# Patient Record
Sex: Male | Born: 1975
Health system: Southern US, Community
[De-identification: ages and names within clinical notes are randomized; demographics above are authoritative.]

## PROBLEM LIST (undated history)

## (undated) DIAGNOSIS — T7840XA Allergy, unspecified, initial encounter: Secondary | ICD-10-CM

## (undated) DIAGNOSIS — K219 Gastro-esophageal reflux disease without esophagitis: Secondary | ICD-10-CM

## (undated) DIAGNOSIS — K5792 Diverticulitis of intestine, part unspecified, without perforation or abscess without bleeding: Secondary | ICD-10-CM

## (undated) DIAGNOSIS — Z8619 Personal history of other infectious and parasitic diseases: Secondary | ICD-10-CM

## (undated) DIAGNOSIS — H8109 Meniere's disease, unspecified ear: Secondary | ICD-10-CM

## (undated) DIAGNOSIS — K589 Irritable bowel syndrome without diarrhea: Secondary | ICD-10-CM

## (undated) HISTORY — DX: Gastro-esophageal reflux disease without esophagitis: K21.9

## (undated) HISTORY — DX: Diverticulitis of intestine, part unspecified, without perforation or abscess without bleeding: K57.92

## (undated) HISTORY — PX: WISDOM TOOTH EXTRACTION: SHX21

## (undated) HISTORY — DX: Personal history of other infectious and parasitic diseases: Z86.19

## (undated) HISTORY — DX: Meniere's disease, unspecified ear: H81.09

## (undated) HISTORY — DX: Allergy, unspecified, initial encounter: T78.40XA

---
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
## 2001-11-29 ENCOUNTER — Encounter: Admission: RE | Admit: 2001-11-29 | Discharge: 2001-11-29 | Payer: Self-pay | Admitting: Family Medicine

## 2001-11-29 ENCOUNTER — Encounter: Payer: Self-pay | Admitting: Family Medicine

## 2007-03-29 ENCOUNTER — Emergency Department (HOSPITAL_COMMUNITY): Admission: EM | Admit: 2007-03-29 | Discharge: 2007-03-29 | Payer: Self-pay | Admitting: Emergency Medicine

## 2007-07-12 ENCOUNTER — Emergency Department (HOSPITAL_COMMUNITY): Admission: EM | Admit: 2007-07-12 | Discharge: 2007-07-12 | Payer: Self-pay | Admitting: Emergency Medicine

## 2014-09-18 ENCOUNTER — Ambulatory Visit (INDEPENDENT_AMBULATORY_CARE_PROVIDER_SITE_OTHER): Payer: 59 | Admitting: Urology

## 2014-09-18 ENCOUNTER — Encounter: Payer: Self-pay | Admitting: Urology

## 2014-09-18 VITALS — BP 115/77 | HR 65 | Ht 68.0 in | Wt 229.0 lb

## 2014-09-18 DIAGNOSIS — Z3009 Encounter for other general counseling and advice on contraception: Secondary | ICD-10-CM

## 2014-09-18 DIAGNOSIS — Z309 Encounter for contraceptive management, unspecified: Secondary | ICD-10-CM

## 2014-09-18 DIAGNOSIS — Z0000 Encounter for general adult medical examination without abnormal findings: Secondary | ICD-10-CM | POA: Insufficient documentation

## 2014-09-18 NOTE — Progress Notes (Signed)
09/18/2014 9:37 AM   Chad Ward [birth year] [id number]  Referring provider: No referring provider defined for this encounter.  Chief Complaint  Patient presents with  . VAS Consult    HPI: 1 - Desire for Male Sterilization - pt with 3 healthy boys. He and his wife have been considering vasectomy as means of sterilization for about 1 year. No h/o easy bleeding or GU trauma. Vas palpable bilaterally but with some sdifficulty due to very stocky body habitus and thick cords.  PMH unremarkable otherwise  Today "Chad Ward" is seen as new patient for above. He is self-referred.    PMH: Past Medical History  Diagnosis Date  . GERD (gastroesophageal reflux disease)     Surgical History: Past Surgical History  Procedure Laterality Date  . Tonsillectomy and adenoidectomy  1988    Home Medications:    Medication List       This list is accurate as of: 09/18/14  9:37 AM.  Always use your most recent med list.               loratadine 10 MG tablet  Commonly known as:  CLARITIN  Take 10 mg by mouth daily.        Allergies: No Known Allergies  Family History: Family History  Problem Relation Age of Onset  . Prostate cancer Neg Hx   . Kidney cancer Neg Hx   . Bladder Cancer Neg Hx     Social History:  reports that he has never smoked. He does not have any smokeless tobacco history on file. He reports that he does not drink alcohol or use illicit drugs.  ROS: UROLOGY Frequent Urination?: No Hard to postpone urination?: No Burning/pain with urination?: No Get up at night to urinate?: No Leakage of urine?: No Urine stream starts and stops?: No Trouble starting stream?: No Do you have to strain to urinate?: No Blood in urine?: No Urinary tract infection?: No Sexually transmitted disease?: No Injury to kidneys or bladder?: No Painful intercourse?: No Weak stream?: No Erection problems?: No Penile pain?: No  Gastrointestinal Nausea?: No Vomiting?:  No Indigestion/heartburn?: No Diarrhea?: Yes Constipation?: No  Constitutional Fever: No Night sweats?: No Weight loss?: No Fatigue?: No  Skin Skin rash/lesions?: No Itching?: No  Eyes Blurred vision?: No Double vision?: No  Ears/Nose/Throat Sore throat?: No Sinus problems?: Yes  Hematologic/Lymphatic Swollen glands?: No Easy bruising?: No  Cardiovascular Leg swelling?: No Chest pain?: No  Respiratory Cough?: No Shortness of breath?: No  Endocrine Excessive thirst?: No  Musculoskeletal Back pain?: No Joint pain?: No  Neurological Headaches?: No Dizziness?: No  Psychologic Depression?: No Anxiety?: No  Physical Exam: BP 115/77 mmHg  Pulse 65  Ht 5\' 8"  (1.727 m)  Wt 229 lb (103.874 kg)  BMI 34.83 kg/m2  Constitutional:  Alert and oriented, No acute distress. HEENT: [place] AT, moist mucus membranes.  Trachea midline, no masses. Cardiovascular: No clubbing, cyanosis, or edema. Respiratory: Normal respiratory effort, no increased work of breathing. GI: Abdomen is soft, nontender, nondistended, no abdominal masses GU: No CVA tenderness. Bilateral vas palpable but thick cords and abbreviated scrotum, hidden penis.  Skin: No rashes, bruises or suspicious lesions. Lymph: No cervical or inguinal adenopathy. Neurologic: Grossly intact, no focal deficits, moving all 4 extremities. Psychiatric: Normal mood and affect.  Laboratory Data: No results found for: WBC, HGB, HCT, MCV, PLT  No results found for: CREATININE  No results found for: PSA  No results found for: TESTOSTERONE  No results found  for: HGBA1C  Urinalysis No results found for: COLORURINE, APPEARANCEUR, LABSPEC, PHURINE, GLUCOSEU, HGBUR, BILIRUBINUR, KETONESUR, PROTEINUR, UROBILINOGEN, NITRITE, LEUKOCYTESUR  Pertinent Imaging: none  Assessment & Plan:    1. Vasectomy evaluation - Discussed risks, benefits, alternatives in detail including other means of contraception and expected  peri-procedural course. Rec done i nOR due to body habitus and he is amenable. Also explained need for f/u negative semen sample prior to unprotected intercourse and he voiced understanding.    Return in about 3 months (around 12/19/2014).  Alexis Frock, [facility] [address] [place], [place] [address] [phone]

## 2014-09-21 ENCOUNTER — Encounter: Payer: Self-pay | Admitting: *Deleted

## 2014-09-21 ENCOUNTER — Inpatient Hospital Stay: Admission: RE | Admit: 2014-09-21 | Payer: Self-pay | Source: Ambulatory Visit

## 2014-09-21 NOTE — Patient Instructions (Signed)
  Your procedure is scheduled on: 09-26-14 Report to [place] To find out your arrival time please call [phone] between 1PM - 3PM on 09-25-14  Remember: Instructions that are not followed completely may result in serious medical risk, up to and including death, or upon the discretion of your surgeon and anesthesiologist your surgery may need to be rescheduled.    _X___ 1. Do not eat food or drink liquids after midnight. No gum chewing or hard candies.     _X___ 2. No Alcohol for 24 hours before or after surgery.   ____ 3. Bring all medications with you on the day of surgery if instructed.    ____ 4. Notify your doctor if there is any change in your medical condition     (cold, fever, infections).     Do not wear jewelry, make-up, hairpins, clips or nail polish.  Do not wear lotions, powders, or perfumes. You may wear deodorant.  Do not shave 48 hours prior to surgery. Men may shave face and neck.  Do not bring valuables to the hospital.    [facility] is not responsible for any belongings or valuables.               Contacts, dentures or bridgework may not be worn into surgery.  Leave your suitcase in the car. After surgery it may be brought to your room.  For patients admitted to the hospital, discharge time is determined by your  treatment team.   Patients discharged the day of surgery will not be allowed to drive home.   Please read over the following fact sheets that you were given:      ____ Take these medicines the morning of surgery with A SIP OF WATER:    1. NONE  2.   3.   4.  5.  6.  ____ Fleet Enema (as directed)   ____ Use CHG Soap as directed  ____ Use inhalers on the day of surgery  ____ Stop metformin 2 days prior to surgery    ____ Take 1/2 of usual insulin dose the night before surgery and none on the morning of surgery.   ____ Stop Coumadin/Plavix/aspirin-N/A  _X___ Stop Anti-inflammatories-STOP IBUPROFEN NOW-NO  NSAIDS OR ASA PRODUCTS-TYLENOL OK   ____ Stop supplements until after surgery.    ____ Bring C-Pap to the hospital.

## 2014-09-26 ENCOUNTER — Ambulatory Visit: Payer: 59 | Admitting: *Deleted

## 2014-09-26 ENCOUNTER — Ambulatory Visit
Admission: RE | Admit: 2014-09-26 | Discharge: 2014-09-26 | Disposition: A | Discharge status: Home / Self Care | Payer: 59 | Source: Ambulatory Visit | Attending: Urology | Admitting: Urology

## 2014-09-26 ENCOUNTER — Encounter: Payer: Self-pay | Admitting: *Deleted

## 2014-09-26 ENCOUNTER — Encounter
Admission: RE | Disposition: A | Discharge status: Home / Self Care | Payer: Self-pay | Source: Ambulatory Visit | Attending: Urology

## 2014-09-26 DIAGNOSIS — Z302 Encounter for sterilization: Secondary | ICD-10-CM | POA: Insufficient documentation

## 2014-09-26 DIAGNOSIS — E669 Obesity, unspecified: Secondary | ICD-10-CM | POA: Insufficient documentation

## 2014-09-26 DIAGNOSIS — Z6834 Body mass index (BMI) 34.0-34.9, adult: Secondary | ICD-10-CM | POA: Insufficient documentation

## 2014-09-26 DIAGNOSIS — K219 Gastro-esophageal reflux disease without esophagitis: Secondary | ICD-10-CM | POA: Insufficient documentation

## 2014-09-26 HISTORY — PX: VASECTOMY: SHX75

## 2014-09-26 SURGERY — VASECTOMY
Anesthesia: General | Wound class: Clean Contaminated

## 2014-09-26 MED ORDER — FAMOTIDINE 20 MG PO TABS
ORAL_TABLET | ORAL | Status: AC
  Administered 2014-09-26: 20 mg via ORAL
  Filled 2014-09-26: qty 1

## 2014-09-26 MED ORDER — CLINDAMYCIN PHOSPHATE 900 MG/50ML IV SOLN
900.0000 mg | Freq: Once | INTRAVENOUS | Status: AC
  Administered 2014-09-26: 900 mg via INTRAVENOUS

## 2014-09-26 MED ORDER — ONDANSETRON HCL 4 MG/2ML IJ SOLN: 4.0000 mg | Freq: Once | INTRAMUSCULAR | Status: DC | PRN

## 2014-09-26 MED ORDER — BUPIVACAINE HCL 0.5 % IJ SOLN
INTRAMUSCULAR | Status: DC | PRN
  Administered 2014-09-26: 7 mL

## 2014-09-26 MED ORDER — LACTATED RINGERS IV SOLN
INTRAVENOUS | Status: DC
  Administered 2014-09-26: 14:00:00 via INTRAVENOUS

## 2014-09-26 MED ORDER — FENTANYL CITRATE (PF) 100 MCG/2ML IJ SOLN
25.0000 ug | INTRAMUSCULAR | Status: DC | PRN
Start: 2014-09-26 — End: 2014-09-26

## 2014-09-26 MED ORDER — MIDAZOLAM HCL 2 MG/2ML IJ SOLN
INTRAMUSCULAR | Status: DC | PRN
  Administered 2014-09-26: 2 mg via INTRAVENOUS

## 2014-09-26 MED ORDER — CLINDAMYCIN PHOSPHATE 900 MG/50ML IV SOLN
INTRAVENOUS | Status: AC
  Administered 2014-09-26: 900 mg via INTRAVENOUS
  Filled 2014-09-26: qty 50

## 2014-09-26 MED ORDER — PROPOFOL 10 MG/ML IV BOLUS
INTRAVENOUS | Status: DC | PRN
  Administered 2014-09-26 (×3): 50 mg via INTRAVENOUS
  Administered 2014-09-26: 100 mg via INTRAVENOUS

## 2014-09-26 MED ORDER — FENTANYL CITRATE (PF) 100 MCG/2ML IJ SOLN
INTRAMUSCULAR | Status: DC | PRN
  Administered 2014-09-26: 100 ug via INTRAVENOUS
  Administered 2014-09-26 (×2): 25 ug via INTRAVENOUS

## 2014-09-26 MED ORDER — FAMOTIDINE 20 MG PO TABS
20.0000 mg | ORAL_TABLET | Freq: Once | ORAL | Status: AC
  Administered 2014-09-26: 20 mg via ORAL

## 2014-09-26 MED ORDER — BUPIVACAINE HCL (PF) 0.5 % IJ SOLN
INTRAMUSCULAR | Status: AC
  Filled 2014-09-26: qty 30

## 2014-09-26 SURGICAL SUPPLY — 30 items
BLADE CLIPPER SURG (BLADE) ×3 IMPLANT
BLADE SURG 15 STRL LF DISP TIS (BLADE) ×1 IMPLANT
BLADE SURG 15 STRL SS (BLADE) ×3
CANISTER SUCT 1200ML W/VALVE (MISCELLANEOUS) ×3 IMPLANT
CHLORAPREP W/TINT 26ML (MISCELLANEOUS) ×3 IMPLANT
DRESSING TELFA 4X3 1S ST N-ADH (GAUZE/BANDAGES/DRESSINGS) ×3 IMPLANT
ELECT CAUTERY NEEDLE TIP 1.0 (MISCELLANEOUS) ×3
ELECTRODE CAUTERY NEDL TIP 1.0 (MISCELLANEOUS) ×1 IMPLANT
GAUZE FLUFF 18X24 1PLY STRL (GAUZE/BANDAGES/DRESSINGS) ×3 IMPLANT
GAUZE SPONGE 4X4 12PLY STRL (GAUZE/BANDAGES/DRESSINGS) ×1 IMPLANT
GLOVE BIO SURGEON STRL SZ 6.5 (GLOVE) ×2 IMPLANT
GLOVE BIO SURGEONS STRL SZ 6.5 (GLOVE) ×1
GOWN STRL REUS W/ TWL LRG LVL3 (GOWN DISPOSABLE) ×2 IMPLANT
GOWN STRL REUS W/TWL LRG LVL3 (GOWN DISPOSABLE) ×6
KIT RM TURNOVER STRD PROC AR (KITS) ×3 IMPLANT
LABEL OR SOLS (LABEL) ×1 IMPLANT
NDL SAFETY 22GX1.5 (NEEDLE) ×3 IMPLANT
NDL SAFETY 25GX1.5 (NEEDLE) ×3 IMPLANT
NS IRRIG 500ML POUR BTL (IV SOLUTION) ×3 IMPLANT
PACK BASIN MINOR ARMC (MISCELLANEOUS) ×3 IMPLANT
PAD GROUND ADULT SPLIT (MISCELLANEOUS) ×3 IMPLANT
PREP PVP WINGED SPONGE (MISCELLANEOUS) ×1 IMPLANT
SUCTION FRAZIER TIP 10 FR DISP (SUCTIONS) ×3 IMPLANT
SUPPORETR ATHLETIC LG (MISCELLANEOUS) ×1 IMPLANT
SUPPORTER ATHLETIC LG (MISCELLANEOUS) ×3
SUT CHROMIC 3 0 PS 2 (SUTURE) ×3 IMPLANT
SUT VIC AB 3-0 SH 27 (SUTURE) ×3
SUT VIC AB 3-0 SH 27X BRD (SUTURE) ×1 IMPLANT
SYRINGE 10CC LL (SYRINGE) ×3 IMPLANT
WATER STERILE IRR 1000ML POUR (IV SOLUTION) ×1 IMPLANT

## 2014-09-26 NOTE — Interval H&P Note (Signed)
History and Physical Interval Note:  09/26/2014 1:44 PM  Chad Ward  has presented today for surgery, with the diagnosis of sterilization  The various methods of treatment have been discussed with the patient and family. After consideration of risks, benefits and other options for treatment, the patient has consented to  Procedure(s): VASECTOMY (N/A) as a surgical intervention .  The patient's history has been reviewed, patient examined, no change in status, stable for surgery.  I have reviewed the patient's chart and labs.  Questions were answered to the patient's satisfaction.    RRR CTAB  Hollice Espy

## 2014-09-26 NOTE — Discharge Instructions (Addendum)
Vasectomy                                    Patient Education and Post Care   If you were provided a sedative (Valium) you must have someone available to drive you home after your procedure. Avoid strenuous activity for 48 hours after your procedure. This includes any heavy lifting. You may return to work the next day, as long as no strenuous activity is required. Leave bandage in place for the next 24hrs. If you have any difficulty urinating remove you may remove bandage earlier. Then keep area clean and dry. You may shower after 24 hours. Do not take a bath or use a hot tub for five (5) days. You may apply ice or a cold pack to the scrotum as needed, for 10 minutes of every hours. (A bag of frozen peas will mold to the area). This may help reduce any pain or swelling. It may also be helpful to wear supportive underwear, such as briefs. Expect some mild pain, mild swelling of the scrotum and possible slight fluid leak from the site of the puncture. A gauze pad may be applied to the scrotum if there is any leakage from the puncture site. This drainage may continue for several days and is normal. You may continue your normal diet. After the procedure, you will be given 1-2 specimen cups. You need to do sperm checks at 6 and/or 12 weeks. Please bring the specimen back to our office to be sent out for analysis. You may resume having sex 1 week after procedure, if comfortable enough. Until you are told that you are sterile, it is essential that you use another form of birth control until otherwise notified by our office. Take Extra-Strength Tylenol, Motrin or Advil as needed for any pain or discomfort. Follow the package directions regarding dose. Stronger prescription pain medication is provided, but most people do not find that it is necessary. If you experience unusual or severe pain that is not relieved by pain medication, excessive bleeding  or drainage, excessive swelling or redness, foul odor or a fever over 101 F, please contact our office.  [facility] [address] [place], [place] [address] [phone]       General Anesthesia, Care After Refer to this sheet in the next few weeks. These instructions provide you with information on caring for yourself after your procedure. Your health care provider may also give you more specific instructions. Your treatment has been planned according to current medical practices, but problems sometimes occur. Call your health care provider if you have any problems or questions after your procedure. WHAT TO EXPECT AFTER THE PROCEDURE After the procedure, it is typical to experience:  Sleepiness.  Nausea and vomiting. HOME CARE INSTRUCTIONS  For the first 24 hours after general anesthesia:  Have a responsible person with you.  Do not drive a car. If you are alone, do not take public transportation.  Do not drink alcohol.  Do not take medicine that has not been prescribed by your health care provider.  Do not sign important papers or make important decisions.  You may resume a normal diet and activities as directed by your health care provider.  Change bandages (dressings) as directed.  If you have questions or problems that seem related to general anesthesia, call the hospital and ask for the anesthetist or anesthesiologist on call. SEEK MEDICAL CARE IF:  You have nausea and vomiting that continue the day after anesthesia.  You develop a rash. SEEK IMMEDIATE MEDICAL CARE IF:   You have difficulty breathing.  You have chest pain.  You have any allergic problems. Document Released: 05/18/2000 Document Revised: 02/14/2013 Document Reviewed: 08/25/2012 [facility] Patient Information 2015 [place], [place]. This information is not intended to replace advice given to you by your health care provider. Make sure you discuss any questions  you have with your health care provider.

## 2014-09-26 NOTE — Anesthesia Preprocedure Evaluation (Signed)
Anesthesia Evaluation  Patient identified by MRN, date of birth, ID band Patient awake    Reviewed: Allergy & Precautions, NPO status , Patient's Chart, lab work & pertinent test results  Airway Mallampati: III  TM Distance: >3 FB Neck ROM: Full    Dental  (+) Teeth Intact   Pulmonary    Pulmonary exam normal       Cardiovascular Exercise Tolerance: Good negative cardio ROS Normal cardiovascular exam    Neuro/Psych    GI/Hepatic GERD-  Medicated and Controlled,  Endo/Other    Renal/GU      Musculoskeletal   Abdominal (+) + obese,   Peds  Hematology   Anesthesia Other Findings   Reproductive/Obstetrics                             Anesthesia Physical Anesthesia Plan  ASA: II  Anesthesia Plan: General   Post-op Pain Management:    Induction: Intravenous  Airway Management Planned: LMA  Additional Equipment:   Intra-op Plan:   Post-operative Plan: Extubation in OR  Informed Consent: I have reviewed the patients History and Physical, chart, labs and discussed the procedure including the risks, benefits and alternatives for the proposed anesthesia with the patient or authorized representative who has indicated his/her understanding and acceptance.     Plan Discussed with: CRNA  Anesthesia Plan Comments:         Anesthesia Quick Evaluation

## 2014-09-26 NOTE — Anesthesia Postprocedure Evaluation (Signed)
  Anesthesia Post-op Note  Patient: Park Meo  Procedure(s) Performed: Procedure(s): VASECTOMY (N/A)  Anesthesia type:General  Patient location: PACU  Post pain: Pain level controlled  Post assessment: Post-op Vital signs reviewed, Patient's Cardiovascular Status Stable, Respiratory Function Stable, Patent Airway and No signs of Nausea or vomiting  Post vital signs: Reviewed and stable  Last Vitals:  Filed Vitals:   09/26/14 1317  BP: 121/59  Pulse: 60  Temp: 36.6 C  Resp:     Level of consciousness: awake, alert  and patient cooperative  Complications: No apparent anesthesia complications

## 2014-09-26 NOTE — Transfer of Care (Signed)
Immediate Anesthesia Transfer of Care Note  Patient: Chad Ward  Procedure(s) Performed: Procedure(s): VASECTOMY (N/A)  Patient Location: PACU  Anesthesia Type:General  Level of Consciousness: sedated and responds to stimulation  Airway & Oxygen Therapy: Patient Spontanous Breathing and Patient connected to nasal cannula oxygen  Post-op Assessment: Report given to RN and Post -op Vital signs reviewed and stable  Post vital signs: Reviewed and stable  Last Vitals:  Filed Vitals:   09/26/14 1452  BP: 98/60  Pulse: 64  Temp: 36.6 C  Resp: 13    Complications: No apparent anesthesia complications

## 2014-09-26 NOTE — H&P (View-Only) (Signed)
09/18/2014 9:37 AM   Chad Ward [birth year] [id number]  Referring provider: No referring provider defined for this encounter.  Chief Complaint  Patient presents with  . VAS Consult    HPI: 1 - Desire for Male Sterilization - pt with 3 healthy boys. He and his wife have been considering vasectomy as means of sterilization for about 1 year. No h/o easy bleeding or GU trauma. Vas palpable bilaterally but with some sdifficulty due to very stocky body habitus and thick cords.  PMH unremarkable otherwise  Today "Chad Ward" is seen as new patient for above. He is self-referred.    PMH: Past Medical History  Diagnosis Date  . GERD (gastroesophageal reflux disease)     Surgical History: Past Surgical History  Procedure Laterality Date  . Tonsillectomy and adenoidectomy  1988    Home Medications:    Medication List       This list is accurate as of: 09/18/14  9:37 AM.  Always use your most recent med list.               loratadine 10 MG tablet  Commonly known as:  CLARITIN  Take 10 mg by mouth daily.        Allergies: No Known Allergies  Family History: Family History  Problem Relation Age of Onset  . Prostate cancer Neg Hx   . Kidney cancer Neg Hx   . Bladder Cancer Neg Hx     Social History:  reports that he has never smoked. He does not have any smokeless tobacco history on file. He reports that he does not drink alcohol or use illicit drugs.  ROS: UROLOGY Frequent Urination?: No Hard to postpone urination?: No Burning/pain with urination?: No Get up at night to urinate?: No Leakage of urine?: No Urine stream starts and stops?: No Trouble starting stream?: No Do you have to strain to urinate?: No Blood in urine?: No Urinary tract infection?: No Sexually transmitted disease?: No Injury to kidneys or bladder?: No Painful intercourse?: No Weak stream?: No Erection problems?: No Penile pain?: No  Gastrointestinal Nausea?: No Vomiting?:  No Indigestion/heartburn?: No Diarrhea?: Yes Constipation?: No  Constitutional Fever: No Night sweats?: No Weight loss?: No Fatigue?: No  Skin Skin rash/lesions?: No Itching?: No  Eyes Blurred vision?: No Double vision?: No  Ears/Nose/Throat Sore throat?: No Sinus problems?: Yes  Hematologic/Lymphatic Swollen glands?: No Easy bruising?: No  Cardiovascular Leg swelling?: No Chest pain?: No  Respiratory Cough?: No Shortness of breath?: No  Endocrine Excessive thirst?: No  Musculoskeletal Back pain?: No Joint pain?: No  Neurological Headaches?: No Dizziness?: No  Psychologic Depression?: No Anxiety?: No  Physical Exam: BP 115/77 mmHg  Pulse 65  Ht 5\' 8"  (1.727 m)  Wt 229 lb (103.874 kg)  BMI 34.83 kg/m2  Constitutional:  Alert and oriented, No acute distress. HEENT: [place] AT, moist mucus membranes.  Trachea midline, no masses. Cardiovascular: No clubbing, cyanosis, or edema. Respiratory: Normal respiratory effort, no increased work of breathing. GI: Abdomen is soft, nontender, nondistended, no abdominal masses GU: No CVA tenderness. Bilateral vas palpable but thick cords and abbreviated scrotum, hidden penis.  Skin: No rashes, bruises or suspicious lesions. Lymph: No cervical or inguinal adenopathy. Neurologic: Grossly intact, no focal deficits, moving all 4 extremities. Psychiatric: Normal mood and affect.  Laboratory Data: No results found for: WBC, HGB, HCT, MCV, PLT  No results found for: CREATININE  No results found for: PSA  No results found for: TESTOSTERONE  No results found  for: HGBA1C  Urinalysis No results found for: COLORURINE, APPEARANCEUR, LABSPEC, PHURINE, GLUCOSEU, HGBUR, BILIRUBINUR, KETONESUR, PROTEINUR, UROBILINOGEN, NITRITE, LEUKOCYTESUR  Pertinent Imaging: none  Assessment & Plan:    1. Vasectomy evaluation - Discussed risks, benefits, alternatives in detail including other means of contraception and expected  peri-procedural course. Rec done i nOR due to body habitus and he is amenable. Also explained need for f/u negative semen sample prior to unprotected intercourse and he voiced understanding.    Return in about 3 months (around 12/19/2014).  Alexis Frock, [facility] [address] [place], [place] [address] [phone]

## 2014-09-26 NOTE — Op Note (Signed)
Date of procedure: 09/26/2014  Preoperative diagnosis:  1. Desires vasectomy    Postoperative diagnosis:  1. Same as above   Procedure: 1. Bilateral vasectomy  Surgeon: Hollice Espy, MD  Anesthesia: General  Complications: None  EBL: minimal  Specimens: bilateral vasal segments  Drains: non3  Indication: Chad Ward is a 39 y.o. patient with desiring vasectomy  After reviewing the management options for treatment, he elected to proceed with the above surgical procedure(s). We have discussed the potential benefits and risks of the procedure, side effects of the proposed treatment, the likelihood of the patient achieving the goals of the procedure, and any potential problems that might occur during the procedure or recuperation. Informed consent has been obtained.  Description of procedure:  The patient was taken to the operating room and MAC was induced.  The patient was placed in the supine position, prepped and draped in the usual sterile fashion, and preoperative antibiotics were administered. A preoperative time-out was performed.   Bilateral Vasectomy Procedure  Pre-Procedure: - Patient's scrotum was prepped and draped for vasectomy. - The vas was palpated through the scrotal skin on the left. - 0.5 % marcaine was injected into the skin and surrounding tissue for placement  - In a similar manner, the vas on the right was identified, anesthetized, and stabilized.  Procedure: - A bladeless technique was used to open the overlying skin (sharp dissection) - The left vas was isolated and brought up through the incision exposing that structure. - Bleeding points were cauterized as they occurred. - The vas was free from the surrounding structures and brought to the view. - A segment was positioned for placement with a hemostat. - A second hemostat was placed and a small segment between the two hemostats and was removed for inspection. - Each end of the transected vas  lumen was fulgurated/ obliterated using needlepoint electrocautery -A fascial interposition was performed on testicular end of the vas using #3-0 chromic suture -The same procedure was performed on the right. - A single suture of #3-0 chromic catgut was used to close each lateral scrotal skin incision - A dressing was applied.  Post-Procedure: - Patient was instructed in care of the operative area - A specimen is to be delivered in 12 weeks   -Another form of contraception is to be used until    Hollice Espy, M.D.

## 2014-09-28 LAB — SURGICAL PATHOLOGY

## 2015-01-04 ENCOUNTER — Other Ambulatory Visit: Payer: 59

## 2015-01-04 DIAGNOSIS — Z310 Encounter for reversal of previous sterilization: Secondary | ICD-10-CM

## 2015-01-06 LAB — POST-VAS SPERM EVALUATION,QUAL: Volume: 4.5 mL

## 2015-01-08 ENCOUNTER — Telehealth: Payer: Self-pay

## 2015-01-08 NOTE — Telephone Encounter (Signed)
Spoke with pt in reference to vas sample. Pt voiced understanding. Pt will return in a month with another sample.

## 2015-01-08 NOTE — Telephone Encounter (Signed)
-----   Message from Hollice Espy, MD sent at 01/07/2015  8:51 AM EST ----- Please warn this patient that there IS STILL SPERM in his sample.  I would like him to repeat it in 1 month and continue to use alternative forms of birth control!!!!!  It is possible for this to happen and I am able to confirm that both of his vasa were cut with the portion excised as confirmed by pathologic specimen. It just may take some or time to clear the residual sperm.   Hollice Espy, MD

## 2015-02-08 ENCOUNTER — Other Ambulatory Visit: Payer: 59

## 2015-02-08 DIAGNOSIS — Z310 Encounter for reversal of previous sterilization: Secondary | ICD-10-CM

## 2015-02-10 LAB — POST-VAS SPERM EVALUATION,QUAL
POST-VAS SPERM, QUAL: ABSENT
SEMEN VOLUME: 3.6 mL

## 2015-02-12 ENCOUNTER — Telehealth: Payer: Self-pay

## 2015-02-12 NOTE — Telephone Encounter (Signed)
Spoke with pt in reference to vas specimen. Pt voiced understanding stating he would bring in another specimen next month.

## 2015-02-12 NOTE — Telephone Encounter (Signed)
-----   Message from Hollice Espy, MD sent at 02/11/2015  4:04 PM EST ----- Please let this patient know that there were NO sperm this time.  Since there were sperm only 1 month ago, its probably a good idea to do this [place] to be sure until officially clearing him.    Hollice Espy, MD

## 2015-04-29 ENCOUNTER — Other Ambulatory Visit: Payer: 59

## 2015-04-29 DIAGNOSIS — Z9852 Vasectomy status: Secondary | ICD-10-CM | POA: Diagnosis not present

## 2015-04-30 LAB — POST-VAS SPERM EVALUATION,QUAL
Post-Vas Sperm, Qual: ABSENT
SEMEN VOLUME: 4.5 mL

## 2015-05-01 ENCOUNTER — Telehealth: Payer: Self-pay

## 2015-05-01 NOTE — Telephone Encounter (Signed)
LMOM- most recent analysis negative. Can not birth control.

## 2015-05-01 NOTE — Telephone Encounter (Signed)
-----   Message from Festus Aloe, MD sent at 05/01/2015  1:29 PM EST ----- Notify patient his second semen analysis showed no sperm.  He may stop other birth control.   ----- Message -----    From: Lestine Box, LPN    Sent: [id number]   8:31 AM      To: Festus Aloe, MD    ----- Message -----    From: [facility] Lab Results In Interface    Sent: 04/30/2015   4:40 PM      To: Rowe Robert Clinical

## 2017-06-22 ENCOUNTER — Other Ambulatory Visit: Payer: Self-pay

## 2017-06-22 ENCOUNTER — Emergency Department (INDEPENDENT_AMBULATORY_CARE_PROVIDER_SITE_OTHER): Payer: 59

## 2017-06-22 ENCOUNTER — Emergency Department
Admission: EM | Admit: 2017-06-22 | Discharge: 2017-06-22 | Disposition: A | Discharge status: Home / Self Care | Payer: 59 | Source: Home / Self Care

## 2017-06-22 DIAGNOSIS — R05 Cough: Secondary | ICD-10-CM | POA: Diagnosis not present

## 2017-06-22 DIAGNOSIS — J9811 Atelectasis: Secondary | ICD-10-CM

## 2017-06-22 DIAGNOSIS — J209 Acute bronchitis, unspecified: Secondary | ICD-10-CM | POA: Diagnosis not present

## 2017-06-22 MED ORDER — DOXYCYCLINE HYCLATE 100 MG PO CAPS: 100.0000 mg | ORAL_CAPSULE | Freq: Two times a day (BID) | ORAL | 0 refills | Status: DC

## 2017-06-22 MED ORDER — ALBUTEROL SULFATE HFA 108 (90 BASE) MCG/ACT IN AERS: 1.0000 | INHALATION_SPRAY | Freq: Four times a day (QID) | RESPIRATORY_TRACT | 0 refills | Status: DC | PRN

## 2017-06-22 MED FILL — VENTOLIN HFA 90 MCG INHALER: 108 (90 BAS | 25 days supply | Qty: 18 | Fill #0

## 2017-06-22 MED FILL — DOXYCYCLINE HYCLATE 100 MG: 100 | 10 days supply | Qty: 20 | Fill #0

## 2017-06-22 NOTE — ED Provider Notes (Signed)
[name]    CSN: [id number] Arrival date & time: 06/22/17  0855     History   Chief Complaint Chief Complaint  Patient presents with  . Cough    HPI Chad Ward is a 42 y.o. male.   The history is provided by the patient. No language interpreter was used.  Cough  Cough characteristics:  Productive Sputum characteristics:  Nondescript Severity:  Moderate Onset quality:  Gradual Duration:  4 weeks Timing:  Constant Progression:  Worsening Chronicity:  Recurrent Smoker: no   Context: exposure to allergens   Relieved by:  Nothing Worsened by:  Nothing Ineffective treatments:  None tried   Past Medical History:  Diagnosis Date  . GERD (gastroesophageal reflux disease)     Patient Active Problem List   Diagnosis Date Noted  . Vasectomy evaluation 09/18/2014    Past Surgical History:  Procedure Laterality Date  . TONSILLECTOMY AND ADENOIDECTOMY  1988  . VASECTOMY N/A 09/26/2014   Procedure: VASECTOMY;  Surgeon: Hollice Espy, MD;  Location: [facility] ORS;  Service: Urology;  Laterality: N/A;  . WISDOM TOOTH EXTRACTION         Home Medications    Prior to Admission medications   Medication Sig Start Date End Date Taking? Authorizing Provider  fexofenadine (ALLEGRA) 60 MG tablet Take 60 mg by mouth daily.   Yes [provider]  albuterol (PROVENTIL HFA;VENTOLIN HFA) 108 (90 Base) MCG/ACT inhaler Inhale 1-2 puffs into the lungs every 6 (six) hours as needed for wheezing or shortness of breath. 06/22/17   Fransico Meadow, PA-C  calcium carbonate (TUMS - DOSED IN MG ELEMENTAL CALCIUM) 500 MG chewable tablet Chew 1 tablet by mouth as needed for indigestion or heartburn.    [provider]  doxycycline (VIBRAMYCIN) 100 MG capsule Take 1 capsule (100 mg total) by mouth 2 (two) times daily. 06/22/17   Fransico Meadow, PA-C  ibuprofen (ADVIL,MOTRIN) 200 MG tablet Take 200 mg by mouth every 6 (six) hours as needed.    [provider]     Family History Family History  Problem Relation Age of Onset  . Prostate cancer Neg Hx   . Kidney cancer Neg Hx   . Bladder Cancer Neg Hx     Social History Social History   Tobacco Use  . Smoking status: Never Smoker  . Smokeless tobacco: Never Used  Substance Use Topics  . Alcohol use: No  . Drug use: No     Allergies   Patient has no known allergies.   Review of Systems Review of Systems  Respiratory: Positive for cough.   All other systems reviewed and are negative.    Physical Exam Triage Vital Signs ED Triage Vitals  Enc Vitals Group     BP 06/22/17 0925 118/80     Pulse Rate 06/22/17 0925 75     Resp 06/22/17 0925 18     Temp 06/22/17 0925 98.5 F (36.9 C)     Temp Source 06/22/17 0925 Oral     SpO2 06/22/17 0925 97 %     Weight 06/22/17 0926 227 lb (103 kg)     Height 06/22/17 0926 5\' 8"  (1.727 m)     Head Circumference --      Peak Flow --      Pain Score 06/22/17 0926 0     Pain Loc --      Pain Edu? --      Excl. in [place]? --  No data found.  Updated Vital Signs BP 118/80 (BP Location: Right Arm)   Pulse 75   Temp 98.5 F (36.9 C) (Oral)   Resp 18   Ht 5\' 8"  (1.727 m)   Wt 227 lb (103 kg)   SpO2 97%   BMI 34.52 kg/m   Visual Acuity Right Eye Distance:   Left Eye Distance:   Bilateral Distance:    Right Eye Near:   Left Eye Near:    Bilateral Near:     Physical Exam  Constitutional: He appears well-developed and well-nourished.  HENT:  Head: Normocephalic.  Eyes: Pupils are equal, round, and reactive to light.  Neck: Normal range of motion.  Cardiovascular: Normal rate.  Pulmonary/Chest: Effort normal.  Abdominal: Soft.  Neurological: He is alert.  Skin: Skin is warm.  Psychiatric: He has a normal mood and affect.  Nursing note and vitals reviewed.    UC Treatments / Results  Labs (all labs ordered are listed, but only abnormal results are displayed) Labs Reviewed - No data to  display  EKG None  Radiology Dg Chest 2 View  Result Date: 06/22/2017 CLINICAL DATA:  Cough, congestion EXAM: CHEST - 2 VIEW COMPARISON:  None. FINDINGS: Linear atelectasis in the left base. Right lung is clear. Heart is normal size. No effusions or acute bony abnormality. IMPRESSION: Left base atelectasis. Electronically Signed   By: Rolm Baptise M.D.   On: 06/22/2017 10:04    Procedures Procedures (including critical care time)  Medications Ordered in UC Medications - No data to display  Initial Impression / Assessment and Plan / UC Course  I have reviewed the triage vital signs and the nursing notes.  Pertinent labs & imaging results that were available during my care of the patient were reviewed by me and considered in my medical decision making (see chart for details).     MDM  No pneumonia Final Clinical Impressions(s) / UC Diagnoses   Final diagnoses:  Acute bronchitis, unspecified organism     Discharge Instructions     Return if any problems.   ED Prescriptions    Medication Sig Dispense Auth. Provider   doxycycline (VIBRAMYCIN) 100 MG capsule Take 1 capsule (100 mg total) by mouth 2 (two) times daily. 20 capsule Sofia, Leslie K, PA-C   albuterol (PROVENTIL HFA;VENTOLIN HFA) 108 (90 Base) MCG/ACT inhaler Inhale 1-2 puffs into the lungs every 6 (six) hours as needed for wheezing or shortness of breath. Oklee, Leslie K, [place]     Controlled Substance Prescriptions [place] Controlled Substance Registry consulted? Not Applicable   Fransico Meadow, [place] 06/22/17 1028

## 2017-06-22 NOTE — Discharge Instructions (Signed)
Return if any problems.

## 2017-06-22 NOTE — ED Triage Notes (Signed)
Pt c/o cough and congestion x 1 mos. Cough is sometimes productive. Pt also states he suffers from seasonal allergies but never with cough. Has been taking Robitussin as needed.

## 2017-06-26 ENCOUNTER — Encounter (HOSPITAL_BASED_OUTPATIENT_CLINIC_OR_DEPARTMENT_OTHER): Payer: Self-pay | Admitting: *Deleted

## 2017-06-26 ENCOUNTER — Other Ambulatory Visit: Payer: Self-pay

## 2017-06-26 DIAGNOSIS — Z79899 Other long term (current) drug therapy: Secondary | ICD-10-CM | POA: Insufficient documentation

## 2017-06-26 DIAGNOSIS — R05 Cough: Secondary | ICD-10-CM | POA: Diagnosis not present

## 2017-06-26 DIAGNOSIS — R0789 Other chest pain: Secondary | ICD-10-CM | POA: Diagnosis not present

## 2017-06-26 DIAGNOSIS — J40 Bronchitis, not specified as acute or chronic: Secondary | ICD-10-CM | POA: Diagnosis not present

## 2017-06-26 NOTE — ED Triage Notes (Signed)
Pt reports 4 weeks of cough. Seen Tuesday and had chest rxay and was started on antibiotics and inhaler. Tonight he reports feeling a "pop" in right ribs after coughing spell

## 2017-06-27 ENCOUNTER — Emergency Department (HOSPITAL_BASED_OUTPATIENT_CLINIC_OR_DEPARTMENT_OTHER): Payer: 59

## 2017-06-27 ENCOUNTER — Emergency Department (HOSPITAL_BASED_OUTPATIENT_CLINIC_OR_DEPARTMENT_OTHER)
Admission: EM | Admit: 2017-06-27 | Discharge: 2017-06-27 | Disposition: A | Discharge status: Home / Self Care | Payer: 59 | Attending: Emergency Medicine | Admitting: Emergency Medicine

## 2017-06-27 DIAGNOSIS — Z79899 Other long term (current) drug therapy: Secondary | ICD-10-CM | POA: Diagnosis not present

## 2017-06-27 DIAGNOSIS — R059 Cough, unspecified: Secondary | ICD-10-CM

## 2017-06-27 DIAGNOSIS — J40 Bronchitis, not specified as acute or chronic: Secondary | ICD-10-CM

## 2017-06-27 DIAGNOSIS — R05 Cough: Secondary | ICD-10-CM

## 2017-06-27 DIAGNOSIS — R0781 Pleurodynia: Secondary | ICD-10-CM | POA: Diagnosis not present

## 2017-06-27 DIAGNOSIS — R0789 Other chest pain: Secondary | ICD-10-CM

## 2017-06-27 MED ORDER — NAPROXEN 500 MG PO TABS: 500.0000 mg | ORAL_TABLET | Freq: Two times a day (BID) | ORAL | 0 refills | Status: DC

## 2017-06-27 MED ORDER — DEXAMETHASONE SODIUM PHOSPHATE 10 MG/ML IJ SOLN
10.0000 mg | Freq: Once | INTRAMUSCULAR | Status: AC
  Administered 2017-06-27: 10 mg via INTRAMUSCULAR
  Filled 2017-06-27: qty 1

## 2017-06-27 MED ORDER — CYCLOBENZAPRINE HCL 5 MG PO TABS
5.0000 mg | ORAL_TABLET | Freq: Once | ORAL | Status: AC
  Administered 2017-06-27: 5 mg via ORAL
  Filled 2017-06-27: qty 1

## 2017-06-27 NOTE — ED Provider Notes (Signed)
[facility] DEPART[facility] Provider Note   CSN: [id number] Arrival date & time: 06/26/17  2132     History   Chief Complaint Chief Complaint  Patient presents with  . Cough    HPI Chad Ward is a 42 y.o. male.  HPI  This is a 42 year old male who presents with right-sided chest wall pain.  Patient reports recent diagnosis of bronchitis.  He was seen in urgent care and given albuterol and doxycycline on Tuesday.  He reports some improvement with the albuterol.  However "I continue to cough."  Patient denies any fevers.  He reports symptoms over the last 2 to 3 weeks.  Tonight he was in the shower when he coughed forcefully and noted increased right sided chest wall pain.  It is sharp and nonradiating.  It is worse with movement and worse with coughing.  Currently his pain is 8 out of 10.  He did take an ibuprofen with minimal relief.  He is concerned he may have pulled something.  Past Medical History:  Diagnosis Date  . GERD (gastroesophageal reflux disease)     Patient Active Problem List   Diagnosis Date Noted  . Vasectomy evaluation 09/18/2014    Past Surgical History:  Procedure Laterality Date  . TONSILLECTOMY AND ADENOIDECTOMY  1988  . VASECTOMY N/A 09/26/2014   Procedure: VASECTOMY;  Surgeon: Hollice Espy, MD;  Location: [facility] ORS;  Service: Urology;  Laterality: N/A;  . WISDOM TOOTH EXTRACTION          Home Medications    Prior to Admission medications   Medication Sig Start Date End Date Taking? Authorizing Provider  albuterol (PROVENTIL HFA;VENTOLIN HFA) 108 (90 Base) MCG/ACT inhaler Inhale 1-2 puffs into the lungs every 6 (six) hours as needed for wheezing or shortness of breath. 06/22/17   Fransico Meadow, PA-C  calcium carbonate (TUMS - DOSED IN MG ELEMENTAL CALCIUM) 500 MG chewable tablet Chew 1 tablet by mouth as needed for indigestion or heartburn.    [provider]  doxycycline (VIBRAMYCIN) 100 MG capsule Take 1 capsule  (100 mg total) by mouth 2 (two) times daily. 06/22/17   Fransico Meadow, PA-C  fexofenadine (ALLEGRA) 60 MG tablet Take 60 mg by mouth daily.    [provider]  ibuprofen (ADVIL,MOTRIN) 200 MG tablet Take 200 mg by mouth every 6 (six) hours as needed.    [provider]  naproxen (NAPROSYN) 500 MG tablet Take 1 tablet (500 mg total) by mouth 2 (two) times daily. 06/27/17   Tracia Lacomb, Barbette Hair, MD    Family History Family History  Problem Relation Age of Onset  . Prostate cancer Neg Hx   . Kidney cancer Neg Hx   . Bladder Cancer Neg Hx     Social History Social History   Tobacco Use  . Smoking status: Never Smoker  . Smokeless tobacco: Never Used  Substance Use Topics  . Alcohol use: No  . Drug use: No     Allergies   Patient has no known allergies.   Review of Systems Review of Systems  Constitutional: Negative for fever.  Respiratory: Positive for cough, shortness of breath and wheezing.   Cardiovascular: Positive for chest pain. Negative for leg swelling.  Gastrointestinal: Negative for abdominal pain, nausea and vomiting.  All other systems reviewed and are negative.    Physical Exam Updated Vital Signs BP 132/82 (BP Location: Left Arm)   Pulse 78   Temp 98.5 F (36.9 C) (Oral)  Resp 20   Ht 5\' 8"  (1.727 m)   Wt 103 kg (227 lb)   SpO2 96%   BMI 34.52 kg/m   Physical Exam  Constitutional: He is oriented to person, place, and time. He appears well-developed and well-nourished. No distress.  Overweight  HENT:  Head: Normocephalic and atraumatic.  Cardiovascular: Normal rate, regular rhythm and normal heart sounds.  No murmur heard. Pulmonary/Chest: Effort normal. No respiratory distress. He has wheezes. He exhibits tenderness.  Tenderness palpation right lower chest wall, no overlying skin changes, no crepitus  Abdominal: Soft. Bowel sounds are normal. There is no tenderness. There is no rebound.  Neurological: He is alert and oriented to  person, place, and time.  Skin: Skin is warm and dry.  Psychiatric: He has a normal mood and affect.  Nursing note and vitals reviewed.    ED Treatments / Results  Labs (all labs ordered are listed, but only abnormal results are displayed) Labs Reviewed - No data to display  EKG None  Radiology Dg Chest 2 View  Result Date: 06/27/2017 CLINICAL DATA:  42 year old male with right-sided rib pain. EXAM: CHEST - 2 VIEW COMPARISON:  Chest radiograph dated 06/22/2017 FINDINGS: Minimal bibasilar atelectatic changes. No focal consolidation, pleural effusion, or pneumothorax. The cardiac silhouette is within normal limits. No acute osseous pathology. IMPRESSION: No active cardiopulmonary disease. Electronically Signed   By: Anner Crete M.D.   On: 06/27/2017 02:10    Procedures Procedures (including critical care time)  Medications Ordered in ED Medications  dexamethasone (DECADRON) injection 10 mg (10 mg Intramuscular Given 06/27/17 0215)  cyclobenzaprine (FLEXERIL) tablet 5 mg (5 mg Oral Given 06/27/17 0215)     Initial Impression / Assessment and Plan / ED Course  I have reviewed the triage vital signs and the nursing notes.  Pertinent labs & imaging results that were available during my care of the patient were reviewed by me and considered in my medical decision making (see chart for details).     Patient presents with continued cough and now with right-sided chest pain.  He is overall nontoxic-appearing and vital signs are reassuring.  He has reproducible pain on exam.  He has breath sounds in all lung fields.  He is wheezing.  Have low suspicion for pneumonia but will obtain a chest x-ray for further evaluation of that and pneumothorax given acute change.  Chest x-ray is reassuring.  Given wheezing, patient was given IM Decadron.  Recommend continue inhaler at home.  Patient was reassured.  Continue naproxen for pain.  After history, exam, and medical workup I feel the patient has  been appropriately medically screened and is safe for discharge home. Pertinent diagnoses were discussed with the patient. Patient was given return precautions.   Final Clinical Impressions(s) / ED Diagnoses   Final diagnoses:  Bronchitis  Cough  Chest wall pain    ED Discharge Orders        Ordered    naproxen (NAPROSYN) 500 MG tablet  2 times daily     06/27/17 0219       Merryl Hacker, MD 06/27/17 [phone]

## 2017-06-27 NOTE — Discharge Instructions (Addendum)
You were seen today for right-sided chest wall pain and recent bronchitis.  Your chest x-ray shows no evidence of pneumonia.  Your symptoms are likely viral in nature.  Use inhaler for cough.  Take naproxen for pain.  You were given a dose of long-acting steroids which should help with any wheezing.

## 2017-06-27 NOTE — ED Notes (Signed)
EDP into room, prior to RN assessment, see MD notes, pending orders.   

## 2017-06-27 NOTE — ED Notes (Addendum)
Alert, NAD, calm, interactive, resps e/u, speaking in clear complete sentences, no dyspnea noted, skin W&D, VSS, here for cough and R rib pain, previously/currently treated with doxycycline and inhaler, (denies: sob, nausea, dizziness or visual changes). Family at [facility].

## 2017-07-30 ENCOUNTER — Ambulatory Visit: Payer: 59 | Admitting: Family

## 2017-07-30 ENCOUNTER — Encounter: Payer: Self-pay | Admitting: Family

## 2017-07-30 VITALS — BP 139/70 | HR 74 | Temp 98.5°F | Resp 18 | Ht 68.0 in | Wt 230.0 lb

## 2017-07-30 DIAGNOSIS — Z0000 Encounter for general adult medical examination without abnormal findings: Secondary | ICD-10-CM

## 2017-07-30 DIAGNOSIS — K219 Gastro-esophageal reflux disease without esophagitis: Secondary | ICD-10-CM

## 2017-07-30 DIAGNOSIS — Z23 Encounter for immunization: Secondary | ICD-10-CM

## 2017-07-30 DIAGNOSIS — J4520 Mild intermittent asthma, uncomplicated: Secondary | ICD-10-CM

## 2017-07-30 MED ORDER — MONTELUKAST SODIUM 10 MG PO TABS: 10.0000 mg | ORAL_TABLET | Freq: Every day | ORAL | 3 refills | Status: DC

## 2017-07-30 MED ORDER — ALBUTEROL SULFATE HFA 108 (90 BASE) MCG/ACT IN AERS: 1.0000 | INHALATION_SPRAY | Freq: Four times a day (QID) | RESPIRATORY_TRACT | 2 refills | Status: DC | PRN

## 2017-07-30 MED FILL — MONTELUKAST SOD 10 MG TAB: 10 | 30 days supply | Qty: 30 | Fill #0 | Status: TO

## 2017-07-30 MED FILL — VENTOLIN HFA 90 MCG INHALER: 108 (90 BAS | 25 days supply | Qty: 18 | Fill #0

## 2017-07-30 NOTE — Patient Instructions (Addendum)
Please schedule routine vision/dental visits. Please complete lab work prior to leaving. Work on [employer], exercise, weight loss.  Continue allegra once daily. Add singulair once daily for cough/wheezing.

## 2017-07-30 NOTE — Progress Notes (Signed)
Subjective:    Patient ID: Chad Ward, male    DOB: Apr 05, 1975, 42 y.o.   MRN: [id number]  HPI  Chad Ward is a 42 yr old male who presents today to establish care.   Reports a 3 month hx of cough.  Was treated for cough on 06/22/17.  Prior to that he went to urgent care and was given abx.  Also had some sinus issues that he attributes to allergies.   Was given a steroid shot which helped a lot.    Reports that he coughed and "Popped something."  Turned out to be a pulled muscle.    Reports that he has some mild wheezing as well as cough. Reports that he has been treated for     Uses allegra most days.   Preventative care-  Immunizations: Due for tetanus Diet: needs improvement Exercise: not very often  Dental: due Vision: due  Review of Systems  Constitutional: Positive for unexpected weight change.  HENT: Positive for rhinorrhea. Negative for hearing loss.   Eyes: Negative for visual disturbance.  Respiratory: Positive for cough.   Cardiovascular: Negative for leg swelling.  Gastrointestinal: Positive for diarrhea. Negative for blood in stool and constipation.       Reports some diarrhea, IBS  Genitourinary: Negative for dysuria, frequency and hematuria.  Musculoskeletal: Negative for arthralgias and myalgias.  Skin: Negative for rash.  Neurological:       Some tension headaches  Hematological: Negative for adenopathy.  Psychiatric/Behavioral:       Denies depression or anxiety       Past Medical History:  Diagnosis Date  . Allergy   . GERD (gastroesophageal reflux disease)   . History of chicken pox   . Meniere's disease      Social History   Socioeconomic History  . Marital status: Married    Spouse name: Not on file  . Number of children: Not on file  . Years of education: Not on file  . Highest education level: Not on file  Occupational History  . Not on file  Social Needs  . Financial resource strain: Not on file  . Food insecurity:   Worry: Not on file    Inability: Not on file  . Transportation needs:    Medical: Not on file    Non-medical: Not on file  Tobacco Use  . Smoking status: Never Smoker  . Smokeless tobacco: Never Used  Substance and Sexual Activity  . Alcohol use: No  . Drug use: No  . Sexual activity: Not on file  Lifestyle  . Physical activity:    Days per week: Not on file    Minutes per session: Not on file  . Stress: Not on file  Relationships  . Social connections:    Talks on phone: Not on file    Gets together: Not on file    Attends religious service: Not on file    Active member of club or organization: Not on file    Attends meetings of clubs or organizations: Not on file    Relationship status: Not on file  . Intimate partner violence:    Fear of current or ex partner: Not on file    Emotionally abused: Not on file    Physically abused: Not on file    Forced sexual activity: Not on file  Other Topics Concern  . Not on file  Social History Narrative   Works as Civil Service fast streamer at [employer]  3 sons   Married   Completed bachelors   No pets   Enjoys movies    Past Surgical History:  Procedure Laterality Date  . TONSILLECTOMY AND ADENOIDECTOMY  1988  . VASECTOMY N/A 09/26/2014   Procedure: VASECTOMY;  Surgeon: Hollice Espy, MD;  Location: [facility] ORS;  Service: Urology;  Laterality: N/A;  . WISDOM TOOTH EXTRACTION      Family History  Problem Relation Age of Onset  . Hypertension Mother   . Diverticulitis Mother   . Hypertension Father   . Heart attack Father   . Cancer Maternal Grandfather   . Prostate cancer Neg Hx   . Kidney cancer Neg Hx   . Bladder Cancer Neg Hx   . Hearing loss Neg Hx     No Known Allergies  Current Outpatient Medications on File Prior to Visit  Medication Sig Dispense Refill  . albuterol (PROVENTIL HFA;VENTOLIN HFA) 108 (90 Base) MCG/ACT inhaler Inhale 1-2 puffs into the lungs every 6 (six) hours as needed for wheezing or  shortness of breath. 1 Inhaler 0  . calcium carbonate (TUMS - DOSED IN MG ELEMENTAL CALCIUM) 500 MG chewable tablet Chew 1 tablet by mouth as needed for indigestion or heartburn.    . fexofenadine (ALLEGRA) 60 MG tablet Take 60 mg by mouth daily.     No current facility-administered medications on file prior to visit.     BP 139/70 (BP Location: Right Arm, Cuff Size: Large)   Pulse 74   Temp 98.5 F (36.9 C) (Oral)   Resp 18   Ht 5\' 8"  (1.727 m)   Wt 230 lb (104.3 kg)   SpO2 98%   BMI 34.97 kg/m    Objective:   Physical Exam Physical Exam  Constitutional: He is oriented to person, place, and time. He appears well-developed and well-nourished. No distress.  HENT:  Head: Normocephalic and atraumatic.  Right Ear: Tympanic membrane and ear canal normal.  Left Ear: Tympanic membrane and ear canal normal.  Mouth/Throat: Oropharynx is clear and moist.  Eyes: Pupils are equal, round, and reactive to light. No scleral icterus.  Neck: Normal range of motion. No thyromegaly present.  Cardiovascular: Normal rate and regular rhythm.   No murmur heard. Pulmonary/Chest: Effort normal. No respiratory distress. He has a soft right sided expiratory wheeze.  He has no rales. He exhibits no tenderness.  Abdominal: Soft. Bowel sounds are normal. He exhibits no distension and no mass. There is no tenderness. There is no rebound and no guarding.  Musculoskeletal: He exhibits no edema.  Lymphadenopathy:    He has no cervical adenopathy.  Neurological: He is alert and oriented to person, place, and time. He has normal patellar reflexes. He exhibits normal muscle tone. Coordination normal.  Skin: Skin is warm and dry.  Psychiatric: He has a normal mood and affect. His behavior is normal. Judgment and thought content normal.           Assessment & Plan:   Preventative care- discussed healthy diet, exercise, weight loss. Tdap today.  Obtain routine lab work.  Asthma- + ongoing cough- will have  pt take allegra daily and add singulair to help with nasal drainage/cough/wheezing. Continue prn albuterol.  GERD- no symptoms currently. If asthma symptoms worsen or fail to improve could consider adding PPI.         Assessment & Plan:

## 2017-07-30 NOTE — Addendum Note (Signed)
Addended by: Kelle Darting A on: 07/30/2017 05:58 PM   Modules accepted: Orders

## 2017-07-31 ENCOUNTER — Encounter: Payer: Self-pay | Admitting: Family

## 2017-07-31 DIAGNOSIS — E785 Hyperlipidemia, unspecified: Secondary | ICD-10-CM | POA: Insufficient documentation

## 2017-07-31 LAB — CBC WITH DIFFERENTIAL/PLATELET
BASOS PCT: 0.6 %
Basophils Absolute: 50 cells/uL (ref 0–200)
Eosinophils Absolute: 174 cells/uL (ref 15–500)
Eosinophils Relative: 2.1 %
HCT: 46.3 % (ref 38.5–50.0)
HEMOGLOBIN: 15.4 g/dL (ref 13.2–17.1)
Lymphs Abs: 2747 cells/uL (ref 850–3900)
MCH: 25.6 pg — ABNORMAL LOW (ref 27.0–33.0)
MCHC: 33.3 g/dL (ref 32.0–36.0)
MCV: 76.9 fL — ABNORMAL LOW (ref 80.0–100.0)
MONOS PCT: 8.2 %
MPV: 10.3 fL (ref 7.5–12.5)
NEUTROS ABS: 4648 {cells}/uL (ref 1500–7800)
Neutrophils Relative %: 56 %
Platelets: 274 10*3/uL (ref 140–400)
RBC: 6.02 10*6/uL — ABNORMAL HIGH (ref 4.20–5.80)
RDW: 14.9 % (ref 11.0–15.0)
Total Lymphocyte: 33.1 %
WBC mixed population: 681 cells/uL (ref 200–950)
WBC: 8.3 10*3/uL (ref 3.8–10.8)

## 2017-07-31 LAB — LIPID PANEL
Cholesterol: 290 mg/dL — ABNORMAL HIGH (ref <200)
HDL: 39 mg/dL — ABNORMAL LOW (ref >40)
LDL Cholesterol (Calc): 194 mg/dL (calc) — ABNORMAL HIGH
Non-HDL Cholesterol (Calc): 251 mg/dL (calc) — ABNORMAL HIGH (ref <130)
Total CHOL/HDL Ratio: 7.4 (calc) — ABNORMAL HIGH (ref <5.0)
Triglycerides: 337 mg/dL — ABNORMAL HIGH (ref <150)

## 2017-07-31 LAB — URINALYSIS, ROUTINE W REFLEX MICROSCOPIC
Bilirubin Urine: NEGATIVE
Glucose, UA: NEGATIVE
Hgb urine dipstick: NEGATIVE
KETONES UR: NEGATIVE
Leukocytes, UA: NEGATIVE
Nitrite: NEGATIVE
PROTEIN: NEGATIVE
Specific Gravity, Urine: 1.021 (ref 1.001–1.03)
pH: 6.5 (ref 5.0–8.0)

## 2017-07-31 LAB — HEPATIC FUNCTION PANEL
AG Ratio: 1.5 (calc) (ref 1.0–2.5)
ALBUMIN MSPROF: 4.6 g/dL (ref 3.6–5.1)
ALT: 29 U/L (ref 9–46)
AST: 19 U/L (ref 10–40)
Alkaline phosphatase (APISO): 99 U/L (ref 40–115)
Bilirubin, Direct: 0.1 mg/dL (ref 0.0–0.2)
Globulin: 3.1 g/dL (calc) (ref 1.9–3.7)
Indirect Bilirubin: 0.4 mg/dL (calc) (ref 0.2–1.2)
Total Bilirubin: 0.5 mg/dL (ref 0.2–1.2)
Total Protein: 7.7 g/dL (ref 6.1–8.1)

## 2017-07-31 LAB — BASIC METABOLIC PANEL
BUN: 20 mg/dL (ref 7–25)
CHLORIDE: 102 mmol/L (ref 98–110)
CO2: 25 mmol/L (ref 20–32)
Calcium: 10 mg/dL (ref 8.6–10.3)
Creat: 1.11 mg/dL (ref 0.60–1.35)
GLUCOSE: 98 mg/dL (ref 65–99)
Potassium: 4.8 mmol/L (ref 3.5–5.3)
Sodium: 142 mmol/L (ref 135–146)

## 2017-07-31 LAB — TSH: TSH: 2.4 m[IU]/L (ref 0.40–4.50)

## 2017-08-02 ENCOUNTER — Telehealth: Payer: Self-pay

## 2017-08-02 NOTE — Telephone Encounter (Signed)
-----   Message from [name], NP sent at 07/31/2017  9:14 PM EDT ----- Please let pt know that his cholesterol and triglycerides are quite elevated.  I would recommend that he work on low fat/low cholesterol diet, exercise weight loss and avoid concentrated sweets/white fluffy carb. Repeat FLP in 3 months.

## 2017-08-02 NOTE — Telephone Encounter (Signed)
Author phoned pt to relay lipid profile results, and relay Melissa, NP's diet and exercise recommendation. Author left VM with call back number [phone]. PEC OK to discuss results and make lab appointment for fasting lipid profile for September timeframe per Lenna Sciara, NP.

## 2017-09-09 ENCOUNTER — Ambulatory Visit: Payer: 59 | Admitting: Family

## 2017-09-09 ENCOUNTER — Encounter: Payer: Self-pay | Admitting: Family

## 2017-09-09 VITALS — BP 125/78 | HR 75 | Temp 98.3°F | Resp 18 | Ht 68.0 in | Wt 237.0 lb

## 2017-09-09 DIAGNOSIS — J309 Allergic rhinitis, unspecified: Secondary | ICD-10-CM | POA: Diagnosis not present

## 2017-09-09 DIAGNOSIS — R059 Cough, unspecified: Secondary | ICD-10-CM

## 2017-09-09 DIAGNOSIS — R05 Cough: Secondary | ICD-10-CM | POA: Diagnosis not present

## 2017-09-09 MED ORDER — FLUTICASONE PROPIONATE 50 MCG/ACT NA SUSP: 2.0000 | Freq: Every day | NASAL | 6 refills | Status: DC

## 2017-09-09 MED FILL — FLUTICASONE PROP 50 MCG SPR: 50 | 30 days supply | Qty: 16 | Fill #0

## 2017-09-09 NOTE — Patient Instructions (Addendum)
Please fax health screening form to attention: Gilmore Laroche at [phone]. Continue singulair and allegra, add flonase.

## 2017-09-09 NOTE — Progress Notes (Signed)
Subjective:    Patient ID: Chad Ward, male    DOB: August 15, 1975, 42 y.o.   MRN: [id number]  HPI  Patient is a 42 yr old male who presents today for follow up.  Last visit he reported a 3 month history of cough.  Last visit we suggested that he take allegra daily and add singulair to help with nasal drainage/cough and wheezing. We also added albuterol prn.    Since making these changes he notes that his cough has improved. He does report continued dryness/throat irritation. Still has some sinus congestion and post-nasal drip.   Review of Systems See HPI  Past Medical History:  Diagnosis Date  . Allergy   . GERD (gastroesophageal reflux disease)   . History of chicken pox   . Meniere's disease      Social History   Socioeconomic History  . Marital status: Married    Spouse name: Not on file  . Number of children: Not on file  . Years of education: Not on file  . Highest education level: Not on file  Occupational History  . Not on file  Social Needs  . Financial resource strain: Not on file  . Food insecurity:    Worry: Not on file    Inability: Not on file  . Transportation needs:    Medical: Not on file    Non-medical: Not on file  Tobacco Use  . Smoking status: Never Smoker  . Smokeless tobacco: Never Used  Substance and Sexual Activity  . Alcohol use: No  . Drug use: No  . Sexual activity: Yes    Partners: Female  Lifestyle  . Physical activity:    Days per week: Not on file    Minutes per session: Not on file  . Stress: Not on file  Relationships  . Social connections:    Talks on phone: Not on file    Gets together: Not on file    Attends religious service: Not on file    Active member of club or organization: Not on file    Attends meetings of clubs or organizations: Not on file    Relationship status: Not on file  . Intimate partner violence:    Fear of current or ex partner: Not on file    Emotionally abused: Not on file    Physically abused:  Not on file    Forced sexual activity: Not on file  Other Topics Concern  . 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 TONSILLECTOMY AND ADENOIDECTOMY  1988  . VASECTOMY N/A 09/26/2014   Procedure: VASECTOMY;  Surgeon: Hollice Espy, MD;  Location: [facility] ORS;  Service: Urology;  Laterality: N/A;  . WISDOM TOOTH EXTRACTION      Family History  Problem Relation Age of Onset  . Hypertension Mother   . Diverticulitis Mother   . Hypertension Father   . Heart attack Father   . Cancer Maternal Grandfather   . Prostate cancer Neg Hx   . Kidney cancer Neg Hx   . Bladder Cancer Neg Hx   . Hearing loss Neg Hx     No Known Allergies  Current Outpatient Medications on File Prior to Visit  Medication Sig Dispense Refill  . calcium carbonate (TUMS - DOSED  IN MG ELEMENTAL CALCIUM) 500 MG chewable tablet Chew 1 tablet by mouth as needed for indigestion or heartburn.    . fexofenadine (ALLEGRA) 60 MG tablet Take 60 mg by mouth daily.    . montelukast (SINGULAIR) 10 MG tablet Take 1 tablet (10 mg total) by mouth at bedtime. 30 tablet 3  . albuterol (PROVENTIL HFA;VENTOLIN HFA) 108 (90 Base) MCG/ACT inhaler Inhale 1-2 puffs into the lungs every 6 (six) hours as needed for wheezing or shortness of breath. (Patient not taking: Reported on 09/09/2017) 1 Inhaler 2   No current facility-administered medications on file prior to visit.     BP 125/78 (BP Location: Right Arm, Cuff Size: Large)   Pulse 75   Temp 98.3 F (36.8 C) (Oral)   Resp 18   Ht 5\' 8"  (1.727 m)   Wt 237 lb (107.5 kg)   SpO2 99%   BMI 36.04 kg/m       Objective:   Physical Exam  Constitutional: He is oriented to person, place, and time. He appears well-developed and well-nourished. No distress.  HENT:  Head: Normocephalic and atraumatic.    Cardiovascular: Normal rate and regular rhythm.  No murmur heard. Pulmonary/Chest: Effort normal and breath sounds normal. No respiratory distress. He has no wheezes. He has no rales.  Musculoskeletal: He exhibits no edema.  Neurological: He is alert and oriented to person, place, and time.  Skin: Skin is warm and dry.  Psychiatric: He has a normal mood and affect. His behavior is normal. Thought content normal.          Assessment & Plan:  Allergic rhinitis- uncontrolled.  Add flonase, continue allegra/singulair. Advised pt to let me know if symptoms worsen or fail to improve.   Cough- improved and was likely related to allergic rhinitis.

## 2017-09-17 MED FILL — MONTELUKAST SOD 10 MG TAB: 10 | 30 days supply | Qty: 30 | Fill #0

## 2017-10-31 ENCOUNTER — Other Ambulatory Visit: Payer: Self-pay

## 2017-10-31 ENCOUNTER — Encounter: Payer: Self-pay | Admitting: Emergency Medicine

## 2017-10-31 ENCOUNTER — Emergency Department
Admission: EM | Admit: 2017-10-31 | Discharge: 2017-10-31 | Disposition: A | Discharge status: Home / Self Care | Payer: 59 | Source: Home / Self Care | Attending: Family Medicine | Admitting: Family Medicine

## 2017-10-31 DIAGNOSIS — R509 Fever, unspecified: Secondary | ICD-10-CM

## 2017-10-31 DIAGNOSIS — R1030 Lower abdominal pain, unspecified: Secondary | ICD-10-CM

## 2017-10-31 DIAGNOSIS — R195 Other fecal abnormalities: Secondary | ICD-10-CM

## 2017-10-31 DIAGNOSIS — Z8379 Family history of other diseases of the digestive system: Secondary | ICD-10-CM

## 2017-10-31 HISTORY — DX: Irritable bowel syndrome, unspecified: K58.9

## 2017-10-31 LAB — COMPLETE METABOLIC PANEL WITH GFR
AG Ratio: 1.7 (calc) (ref 1.0–2.5)
ALT: 46 U/L (ref 9–46)
AST: 34 U/L (ref 10–40)
Albumin: 4.7 g/dL (ref 3.6–5.1)
Alkaline phosphatase (APISO): 106 U/L (ref 40–115)
BUN: 12 mg/dL (ref 7–25)
CO2: 28 mmol/L (ref 20–32)
Calcium: 9.6 mg/dL (ref 8.6–10.3)
Chloride: 102 mmol/L (ref 98–110)
Creat: 1.21 mg/dL (ref 0.60–1.35)
GFR, Est African American: 86 mL/min/{1.73_m2} (ref >=60)
GFR, Est Non African American: 74 mL/min/{1.73_m2} (ref >=60)
Globulin: 2.7 g/dL (calc) (ref 1.9–3.7)
Glucose, Bld: 84 mg/dL (ref 65–99)
Potassium: 4.5 mmol/L (ref 3.5–5.3)
Sodium: 138 mmol/L (ref 135–146)
Total Bilirubin: 1.1 mg/dL (ref 0.2–1.2)
Total Protein: 7.4 g/dL (ref 6.1–8.1)

## 2017-10-31 LAB — POCT URINALYSIS DIP (MANUAL ENTRY)
Bilirubin, UA: NEGATIVE
Blood, UA: NEGATIVE
Glucose, UA: NEGATIVE mg/dL
Ketones, POC UA: NEGATIVE mg/dL
Leukocytes, UA: NEGATIVE
Nitrite, UA: NEGATIVE
Protein Ur, POC: NEGATIVE mg/dL
Spec Grav, UA: 1.015 (ref 1.010–1.025)
Urobilinogen, UA: 0.2 E.U./dL
pH, UA: 8.5 — AB (ref 5.0–8.0)

## 2017-10-31 MED ORDER — METRONIDAZOLE 500 MG PO TABS: 500.0000 mg | ORAL_TABLET | Freq: Three times a day (TID) | ORAL | 0 refills | Status: AC

## 2017-10-31 MED ORDER — CIPROFLOXACIN HCL 500 MG PO TABS: 500.0000 mg | ORAL_TABLET | Freq: Two times a day (BID) | ORAL | 0 refills | Status: DC

## 2017-10-31 NOTE — ED Provider Notes (Signed)
[name]    CSN: [id number] Arrival date & time: 10/31/17  1109     History   Chief Complaint Chief Complaint  Patient presents with  . Abdominal Pain  . Fever    HPI Chad Ward is a 42 y.o. male.   HPI Chad Ward is a 42 y.o. male presenting to UC with c/o 4 days of mild intermittent LLQ abdominal pain that started as a dull ache.  Last night he developed a sharp pain in the same area that is worse with movement. He had chills last night and a temp of 100.7*F this morning. He took Tylenol around 8AM.  Hx of chronic IBS, his stools are typically irregular and loose. Denies blood in stool or change in BMs compared to his typical IBS. Denies nausea or vomiting. Denies urinary symptoms. No hx of diverticulitis but states his family has a hx of diverticulitis- mother, father, an uncle.     Past Medical History:  Diagnosis Date  . Allergy   . GERD (gastroesophageal reflux disease)   . History of chicken pox   . IBS (irritable bowel syndrome)   . Meniere's disease     Patient Active Problem List   Diagnosis Date Noted  . Dyslipidemia 07/31/2017  . Vasectomy evaluation 09/18/2014    Past Surgical History:  Procedure Laterality Date  . TONSILLECTOMY AND ADENOIDECTOMY  1988  . VASECTOMY N/A 09/26/2014   Procedure: VASECTOMY;  Surgeon: Hollice Espy, MD;  Location: [facility];  Service: Urology;  Laterality: N/A;  . WISDOM TOOTH EXTRACTION         Home Medications    Prior to Admission medications   Medication Sig Start Date End Date Taking? Authorizing Provider  albuterol (PROVENTIL HFA;VENTOLIN HFA) 108 (90 Base) MCG/ACT inhaler Inhale 1-2 puffs into the lungs every 6 (six) hours as needed for wheezing or shortness of breath. Patient not taking: Reported on 09/09/2017 07/30/17   [name], NP  calcium carbonate (TUMS - DOSED IN MG ELEMENTAL CALCIUM) 500 MG chewable tablet Chew 1 tablet by mouth as needed for indigestion or heartburn.     [provider]  ciprofloxacin (CIPRO) 500 MG tablet Take 1 tablet (500 mg total) by mouth 2 (two) times daily. One po bid x 7 days 10/31/17   Noe Gens, PA-C  fexofenadine (ALLEGRA) 60 MG tablet Take 60 mg by mouth daily.    [provider]  fluticasone (FLONASE) 50 MCG/ACT nasal spray Place 2 sprays into both nostrils daily. 09/09/17   [name], NP  metroNIDAZOLE (FLAGYL) 500 MG tablet Take 1 tablet (500 mg total) by mouth 3 (three) times daily for 7 days. 10/31/17 11/07/17  Noe Gens, PA-C  montelukast (SINGULAIR) 10 MG tablet Take 1 tablet (10 mg total) by mouth at bedtime. 07/30/17   [name], NP    Family History Family History  Problem Relation Age of Onset  . Hypertension Mother   . Diverticulitis Mother   . Hypertension Father   . Heart attack Father   . Cancer Maternal Grandfather   . Prostate cancer Neg Hx   . Kidney cancer Neg Hx   . Bladder Cancer Neg Hx   . Hearing loss Neg Hx     Social History Social History   Tobacco Use  . Smoking status: Never Smoker  . Smokeless tobacco: Never Used  Substance Use Topics  . Alcohol use: No  . Drug use: No     Allergies  Patient has no known allergies.   Review of Systems Review of Systems  Constitutional: Positive for chills and fever. Negative for appetite change.  Gastrointestinal: Positive for abdominal pain. Negative for blood in stool, constipation, diarrhea, nausea and vomiting.  Musculoskeletal: Negative for back pain and myalgias.     Physical Exam Triage Vital Signs ED Triage Vitals  Enc Vitals Group     BP 10/31/17 1142 112/70     Pulse Rate 10/31/17 1142 89     Resp 10/31/17 1142 (!) 0     Temp 10/31/17 1142 98.9 F (37.2 C)     Temp Source 10/31/17 1142 Oral     SpO2 10/31/17 1142 98 %     Weight 10/31/17 1143 230 lb (104.3 kg)     Height 10/31/17 1143 5\' 8"  (1.727 m)     Head Circumference --      Peak Flow --      Pain Score 10/31/17 1143 0      Pain Loc --      Pain Edu? --      Excl. in [place]? --    No data found.  Updated Vital Signs BP 112/70 (BP Location: Right Arm)   Pulse 89   Temp 98.9 F (37.2 C) (Oral)   Resp 16   Ht 5\' 8"  (1.727 m)   Wt 230 lb (104.3 kg)   SpO2 98%   BMI 34.97 kg/m   Visual Acuity Right Eye Distance:   Left Eye Distance:   Bilateral Distance:    Right Eye Near:   Left Eye Near:    Bilateral Near:     Physical Exam  Constitutional: He is oriented to person, place, and time. He appears well-developed and well-nourished.  Non-toxic appearance. He does not appear ill. No distress.  HENT:  Head: Normocephalic and atraumatic.  Mouth/Throat: Oropharynx is clear and moist.  Eyes: EOM are normal.  Neck: Normal range of motion.  Cardiovascular: Normal rate and regular rhythm.  Pulmonary/Chest: Effort normal and breath sounds normal. No respiratory distress.  Abdominal: Soft. Normal appearance. There is tenderness in the suprapubic area and left lower quadrant. There is no rigidity, no rebound, no guarding and no CVA tenderness.  Musculoskeletal: Normal range of motion.  Neurological: He is alert and oriented to person, place, and time.  Skin: Skin is warm and dry.  Psychiatric: He has a normal mood and affect. His behavior is normal.  Nursing note and vitals reviewed.    UC Treatments / Results  Labs (all labs ordered are listed, but only abnormal results are displayed) Labs Reviewed  POCT URINALYSIS DIP (MANUAL ENTRY) - Abnormal; Notable for the following components:      Result Value   Color, UA light yellow (*)    pH, UA 8.5 (*)    All other components within normal limits  COMPLETE METABOLIC PANEL WITH GFR  POCT CBC W AUTO DIFF ([facility])    EKG None  Radiology No results found.  Procedures Procedures (including critical care time)  Medications Ordered in UC Medications - No data to display  Initial Impression / Assessment and Plan / UC Course  I have  reviewed the triage vital signs and the nursing notes.  Pertinent labs & imaging results that were available during my care of the patient were reviewed by me and considered in my medical decision making (see chart for details).     CBC: WBC- 12.8 Hx and exam c/w diverticulitis CT scanner unavailable  at this time at [facility] (weekned). Pt appears well. No guarding or rebound on exam. No evidence of acute abdomen. Will start pt on empiric treatment with Cipro and Flagyl Encouraged to call his PCP tomorrow for f/u  Discussed symptoms that warrant emergent care in the ED.  Final Clinical Impressions(s) / UC Diagnoses   Final diagnoses:  Lower abdominal pain  Family history of diverticulitis of colon  Chills with fever  Loose stools     Discharge Instructions      Please call your family doctor tomorrow and let her know you were diagnosed with a likely case of diverticulitis. This would be your first episode. A CT scan may be ordered by your family doctor and/or a colonoscopy and gastroenterology (GI) follow up.   Please call 911 or go to the hospital if symptoms worsening- increased pain, worsening fever, blood in stool, unable to keep down fluids, or other new concerning symptoms develop.     ED Prescriptions    Medication Sig Dispense Auth. Provider   ciprofloxacin (CIPRO) 500 MG tablet Take 1 tablet (500 mg total) by mouth 2 (two) times daily. One po bid x 7 days 14 tablet Chantrell Apsey O, PA-C   metroNIDAZOLE (FLAGYL) 500 MG tablet Take 1 tablet (500 mg total) by mouth 3 (three) times daily for 7 days. 21 tablet Noe Gens, PA-C     Controlled Substance Prescriptions [place] Controlled Substance Registry consulted? Not Applicable   Tyrell Antonio 10/31/17 1435

## 2017-10-31 NOTE — ED Triage Notes (Signed)
Gives 4 days history of intermittent left lower abdominal pain; today awoke with fever; no nausea or vomiting; he has chronic IBS and stools have been typical; took tylenol at 0800 for temp of 100.7.

## 2017-10-31 NOTE — Discharge Instructions (Signed)
°  Please call your family doctor tomorrow and let her know you were diagnosed with a likely case of diverticulitis. This would be your first episode. A CT scan may be ordered by your family doctor and/or a colonoscopy and gastroenterology (GI) follow up.   Please call 911 or go to the hospital if symptoms worsening- increased pain, worsening fever, blood in stool, unable to keep down fluids, or other new concerning symptoms develop.

## 2017-11-01 ENCOUNTER — Telehealth: Payer: Self-pay | Admitting: Emergency Medicine

## 2017-11-01 LAB — POCT CBC W AUTO DIFF (K'VILLE URGENT CARE)

## 2017-11-01 NOTE — Telephone Encounter (Signed)
Labs are normal, hope you are feeling better

## 2017-11-03 MED FILL — MONTELUKAST SOD 10 MG TAB: 10 | 30 days supply | Qty: 30 | Fill #1

## 2017-11-09 ENCOUNTER — Ambulatory Visit: Payer: 59 | Admitting: Internal Medicine

## 2017-11-09 ENCOUNTER — Encounter: Payer: Self-pay | Admitting: Gastroenterology

## 2017-11-09 ENCOUNTER — Encounter: Payer: Self-pay | Admitting: Internal Medicine

## 2017-11-09 VITALS — BP 128/84 | HR 84 | Temp 98.4°F | Resp 16 | Ht 68.0 in | Wt 232.0 lb

## 2017-11-09 DIAGNOSIS — K5792 Diverticulitis of intestine, part unspecified, without perforation or abscess without bleeding: Secondary | ICD-10-CM

## 2017-11-09 MED ORDER — AMOXICILLIN-POT CLAVULANATE 875-125 MG PO TABS: 1.0000 | ORAL_TABLET | Freq: Two times a day (BID) | ORAL | 0 refills | Status: DC

## 2017-11-09 MED FILL — AMOX-CLAV 875-125 MG TABLET: 875-125 | 5 days supply | Qty: 10 | Fill #0

## 2017-11-09 NOTE — Progress Notes (Signed)
Subjective:    Patient ID: Chad Ward, male    DOB: [birth year], 42 y.o.   MRN: [id number]  DOS:  11/09/2017 Type of visit - description : UC f/u Interval history: Went to the urgent care and 10/31/2017 with 4 days history of mild intermittent LLQ abdominal pain. On exam he was tender at the suprapubic and LLQ abdominal area without mass or rebound.  White count was 12.8. CT scan was not available at the time, was treated with Cipro and Flagyl.   Review of Systems Since the urgent care visit, he took antibiotics for 5 days and day he stopped because he developed diarrhea. The pain however resolved after 2 days of antibiotics and has not come back. Denies any fever, chills. No blood in the stools. Appetite is very good. Good p.o. tolerance No nausea or vomiting.  Past Medical History:  Diagnosis Date  . Allergy   . GERD (gastroesophageal reflux disease)   . History of chicken pox   . IBS (irritable bowel syndrome)   . Meniere's disease     Past Surgical History:  Procedure Laterality Date  . TONSILLECTOMY AND ADENOIDECTOMY  1988  . VASECTOMY N/A 09/26/2014   Procedure: VASECTOMY;  Surgeon: Hollice Espy, MD;  Location: [facility];  Service: Urology;  Laterality: N/A;  . WISDOM TOOTH EXTRACTION      Social History   Socioeconomic History  . Marital status: Married    Spouse name: Not on file  . Number of children: Not on file  . Years of education: Not on file  . Highest education level: Not on file  Occupational History  . Not on file  Social Needs  . Financial resource strain: Not on file  . Food insecurity:    Worry: Not on file    Inability: Not on file  . Transportation needs:    Medical: Not on file    Non-medical: Not on file  Tobacco Use  . Smoking status: Never Smoker  . Smokeless tobacco: Never Used  Substance and Sexual Activity  . Alcohol use: No  . Drug use: No  . Sexual activity: Yes    Partners: Female  Lifestyle  . Physical activity:   Days per week: Not on file    Minutes per session: Not on file  . Stress: Not on file  Relationships  . Social connections:    Talks on phone: Not on file    Gets together: Not on file    Attends religious service: Not on file    Active member of club or organization: Not on file    Attends meetings of clubs or organizations: Not on file    Relationship status: Not on file  . Intimate partner violence:    Fear of current or ex partner: Not on file    Emotionally abused: Not on file    Physically abused: Not on file    Forced sexual activity: Not on file  Other Topics Concern  . Not on file  Social History Narrative   Works as Mudlogger of recruiting/admissions at [employer]   3 sons   Married   Completed bachelors   No pets   Enjoys movies      Allergies as of 11/09/2017   No Known Allergies     Medication List        Accurate as of 11/09/17 11:59 PM. Always use your most recent med list.          albuterol 108 (90  Base) MCG/ACT inhaler Commonly known as:  PROVENTIL HFA;VENTOLIN HFA Inhale 1-2 puffs into the lungs every 6 (six) hours as needed for wheezing or shortness of breath.   amoxicillin-clavulanate 875-125 MG tablet Commonly known as:  AUGMENTIN Take 1 tablet by mouth 2 (two) times daily.   calcium carbonate 500 MG chewable tablet Commonly known as:  TUMS - dosed in mg elemental calcium Chew 1 tablet by mouth as needed for indigestion or heartburn.   fexofenadine 60 MG tablet Commonly known as:  ALLEGRA Take 60 mg by mouth daily.   fluticasone 50 MCG/ACT nasal spray Commonly known as:  FLONASE Place 2 sprays into both nostrils daily.   montelukast 10 MG tablet Commonly known as:  SINGULAIR Take 1 tablet (10 mg total) by mouth at bedtime.          Objective:   Physical Exam BP 128/84 (BP Location: Left Arm, Patient Position: Sitting, Cuff Size: Normal)   Pulse 84   Temp 98.4 F (36.9 C) (Oral)   Resp 16   Ht 5\' 8"  (1.727 m)   Wt 232 lb  (105.2 kg)   SpO2 96%   BMI 35.28 kg/m  General:   Well developed, NAD, see BMI.  HEENT:  Normocephalic . Face symmetric, atraumatic Lungs:  CTA B Normal respiratory effort, no intercostal retractions, no accessory muscle use. Heart: RRR,  no murmur.  no pretibial edema bilaterally  Abdomen:  Not distended, soft.  Minimal if any tenderness at the left lower quadrant.  No mass, rebound.  Good bowel sounds. Skin: Not pale. Not jaundice Neurologic:  alert & oriented X3.  Speech normal, gait appropriate for age and unassisted Psych--  Cognition and judgment appear intact.  Cooperative with normal attention span and concentration.  Behavior appropriate. No anxious or depressed appearing.     Assessment & Plan:    42 year old gentleman, PMH includes GERD, IBS, history of a vasectomy, presents with Diverticulitis: I agree with the urgent care doctor that the clinical picture is consistent with diverticulitis, no history of previous episodes. He developed diarrhea with Cipro and Flagyl which is a still there but less than before. I recommend to take 5 additional days of antibiotics to complete the treatment, Augmentin RX sent. D/c cipro-flagyl We talk about a high-fiber diet once he is better. Referral to GI given that this is the first episode of diverticulitis. Patient in agreement.

## 2017-11-09 NOTE — Progress Notes (Signed)
Pre visit review using our clinic review tool, if applicable. No additional management support is needed unless otherwise documented below in the visit note. 

## 2017-11-09 NOTE — Patient Instructions (Signed)
Please take 5 additional days of antibiotics, Augmentin.  Keep yourself hydrated  Take a probiotic such as Align to help with the diarrhea.  We are referring you to a gastroenterologist  Call if you are not back to normal in few days  Call if your symptoms come back, you have fever, chills or persistent diarrhea.

## 2017-12-09 ENCOUNTER — Encounter: Payer: Self-pay | Admitting: Gastroenterology

## 2017-12-09 ENCOUNTER — Encounter (INDEPENDENT_AMBULATORY_CARE_PROVIDER_SITE_OTHER): Payer: Self-pay

## 2017-12-09 ENCOUNTER — Ambulatory Visit: Payer: 59 | Admitting: Gastroenterology

## 2017-12-09 VITALS — BP 116/78 | HR 73 | Ht 68.0 in | Wt 230.5 lb

## 2017-12-09 DIAGNOSIS — K580 Irritable bowel syndrome with diarrhea: Secondary | ICD-10-CM | POA: Diagnosis not present

## 2017-12-09 DIAGNOSIS — K5732 Diverticulitis of large intestine without perforation or abscess without bleeding: Secondary | ICD-10-CM | POA: Diagnosis not present

## 2017-12-09 MED ORDER — SOD PICOSULFATE-MAG OX-CIT ACD 10-3.5-12 MG-GM -GM/160ML PO SOLN: 1.0000 | ORAL | 0 refills | Status: DC

## 2017-12-09 NOTE — Progress Notes (Signed)
Chief Complaint: Diverticulitis   Referring Provider:     Colon Branch, MD    HPI:     Chad Ward is a 42 y.o. male with a history of IBS and GERD referred to the [facility] for evaluation of recent acute diverticulitis.  He presented to the urgent care on 10/31/2017 with a 4-day history of intermittent LLQ pain and fever of 100.7 at home.  UC evaluation was notable for leukocytosis (12.8), normal CMP, and was treated with ciprofloxacin/Flagyl which she took for 5 days, then stopped due to diarrhea.  No recurrence of LLQ pain.  No CT was performed at [facility] (not available at the time).  Was seen by Dr. Larose Kells on 11/09/2017, and at that time reported overall improvement in stool consistency/frequency and was otherwise largely asymptomatic.  He was given an additional 5-day course of Augmentin due to diarrhea with the Cipro/Flagyl in order to complete antimicrobial course.  Recommended high-fiber diet and he was referred to GI for further evaluation.  Today he states, he completed the Augmentin as prescribed without issue. Last week, he had a single episode of LLQ pain, similar in location to index sxs, so took 2 days of the leftover Cipro/Flagyl. This occurred in the setting of having to manage at a conference, which he notes a history of IBS that is typically triggered by public speaking/presentations. Sxs again resolved. Today, he is back to his usual state of health. Tolerating all PO intake and no recurrence of fevers since UC in September. Has been taking probiotics since starting Abx.   For his IBS, will take occasional Pepto and Immodium. Maintains high fiber diet for years.  Symptoms worse with times of stress/anxiety, particularly work stressors.  No previous EGD or colonoscopy.   Mother with diverticulitis, but o/w no known family history of CRC, GI malignancy, liver disease, pancreatic disease, or IBD.    Past Medical History:  Diagnosis Date  . Allergy   .  GERD (gastroesophageal reflux disease)   . History of chicken pox   . IBS (irritable bowel syndrome)   . Meniere's disease      Past Surgical History:  Procedure Laterality Date  . TONSILLECTOMY AND ADENOIDECTOMY  1988  . VASECTOMY N/A 09/26/2014   Procedure: VASECTOMY;  Surgeon: Hollice Espy, MD;  Location: [facility] ORS;  Service: Urology;  Laterality: N/A;  . WISDOM TOOTH EXTRACTION     Family History  Problem Relation Age of Onset  . Hypertension Mother   . Diverticulitis Mother   . Hypertension Father   . Heart attack Father   . Cancer Maternal Grandfather   . Prostate cancer Neg Hx   . Kidney cancer Neg Hx   . Bladder Cancer Neg Hx   . Hearing loss Neg Hx    Social History   Tobacco Use  . Smoking status: Never Smoker  . Smokeless tobacco: Never Used  Substance Use Topics  . Alcohol use: No  . Drug use: No   Current Outpatient Medications  Medication Sig Dispense Refill  . albuterol (PROVENTIL HFA;VENTOLIN HFA) 108 (90 Base) MCG/ACT inhaler Inhale 1-2 puffs into the lungs every 6 (six) hours as needed for wheezing or shortness of breath. (Patient not taking: Reported on 09/09/2017) 1 Inhaler 2  . amoxicillin-clavulanate (AUGMENTIN) 875-125 MG tablet Take 1 tablet by mouth 2 (two) times daily. 10 tablet 0  . calcium carbonate (TUMS - DOSED IN MG ELEMENTAL  CALCIUM) 500 MG chewable tablet Chew 1 tablet by mouth as needed for indigestion or heartburn.    . fexofenadine (ALLEGRA) 60 MG tablet Take 60 mg by mouth daily.    . fluticasone (FLONASE) 50 MCG/ACT nasal spray Place 2 sprays into both nostrils daily. (Patient not taking: Reported on 11/09/2017) 16 g 6  . montelukast (SINGULAIR) 10 MG tablet Take 1 tablet (10 mg total) by mouth at bedtime. 30 tablet 3   No current facility-administered medications for this visit.    No Known Allergies   Review of Systems: All systems reviewed and negative except where noted in HPI.     Physical Exam:    Wt Readings from Last 3  Encounters:  11/09/17 232 lb (105.2 kg)  10/31/17 230 lb (104.3 kg)  09/09/17 237 lb (107.5 kg)    There were no vitals taken for this visit. Constitutional:  Pleasant, in no acute distress. Psychiatric: Normal mood and affect. Behavior is normal. EENT: Pupils normal.  Conjunctivae are normal. No scleral icterus. Neck supple. No cervical LAD. Cardiovascular: Normal rate, regular rhythm. No edema Pulmonary/chest: Effort normal and breath sounds normal. No wheezing, rales or rhonchi. Abdominal: Soft, nondistended, nontender. Bowel sounds active throughout. There are no masses palpable. No hepatomegaly. Neurological: Alert and oriented to person place and time. Skin: Skin is warm and dry. No rashes noted.   ASSESSMENT AND PLAN;   DMITRI PETTIGREW is a 42 y.o. male presenting with first episode of acute diverticulitis in September.  1) acute diverticulitis: We discussed diverticular disease at length, to include possible complications of diverticular bleeding, acute diverticulitis, abscess formation, and perforation . We also discussed possible sequelae of diverticulitis such as bowel colonic strictures and diverticular associated colitis. We discussed treatment options, to include monitoring, periodic antimicrobial therapy, and surgical intervention.  We will proceed as below:  - Colonoscopy - Resume probiotics - Resume high fiber diet with fiber supplement as needed  2) IBS-D: Symptoms overall largely well controlled with high-fiber diet and knowledge of exacerbating factors.  No additional medical intervention needed at this time.  - Can evaluate for mucosal or luminal pathology at time of colonoscopy as above with random biopsies to rule out microscopic colitis  The indications, risks, and benefits of colonoscopy were explained to the patient in detail. Risks include but are not limited to bleeding, perforation, adverse reaction to medications, and cardiopulmonary compromise. Sequelae  include but are not limited to the possibility of surgery, hospitalization, and mortality. The patient verbalized understanding and wished to proceed. All questions answered, referred to the scheduler and bowel prep ordered. Further recommendations pending results of the exam.     Lavena Bullion, DO, FACG  12/09/2017, 8:37 AM   [name], NP

## 2017-12-09 NOTE — Patient Instructions (Signed)
If you are age 41 or older, your body mass index should be between 23-30. Your Body mass index is 35.05 kg/m. If this is out of the aforementioned range listed, please consider follow up with your Primary Care Provider.  If you are age 44 or younger, your body mass index should be between 19-25. Your Body mass index is 35.05 kg/m. If this is out of the aformentioned range listed, please consider follow up with your Primary Care Provider.   You have been scheduled for a colonoscopy. Please follow written instructions given to you at your visit today.  Please pick up your prep supplies at the pharmacy within the next 1-3 days. If you use inhalers (even only as needed), please bring them with you on the day of your procedure. Your physician has requested that you go to www.startemmi.com and enter the access code given to you at your visit today. This web site gives a general overview about your procedure. However, you should still follow specific instructions given to you by our office regarding your preparation for the procedure.  We have sent the following medications to your pharmacy for you to pick up at your convenience: Clenpiq  It was a pleasure to see you today!  Vito Cirigliano, D.O.

## 2017-12-24 ENCOUNTER — Encounter: Payer: Self-pay | Admitting: Gastroenterology

## 2017-12-27 ENCOUNTER — Ambulatory Visit: Payer: Self-pay | Admitting: *Deleted

## 2017-12-27 NOTE — Telephone Encounter (Signed)
Patient has been experiencing "pinching" like pains in his chest and under the left breast for one week. At times he will feel the pain in his back or left arm. The pains are brief, less than a few seconds, intermittent and resolve on their own. Denies any SOB/Fever/dizzinessN/V/sweating.He experienced similar pains one month ago just prior to diverticulitis. He has been taking tums and prilosec occasionally for this and unsure if it helped. He was seen by Dr. Irineo Axon on 10/17 and colonoscopy scheduled for 01/07/18.  Appointment scheduled for tomorrow with PCP. Reason for Disposition . [1] Chest pain(s) lasting a few seconds AND [2] persists > 3 days  Answer Assessment - Initial Assessment Questions 1. LOCATION: "Where does it hurt?"       Left of sternum chest pain and sometimes below left breast pain. 2. RADIATION: "Does the pain go anywhere else?" (e.g., into neck, jaw, arms, back)     Sometimes he feels this in his back and occasionally in his left arm sometimes just a pinching sensation. 3. ONSET: "When did the chest pain begin?" (Minutes, hours or days)      About one week 4. PATTERN "Does the pain come and go, or has it been constant since it started?"  "Does it get worse with exertion?"      Comes and goes 5. DURATION: "How long does it last" (e.g., seconds, minutes, hours)     Just seconds 6. SEVERITY: "How bad is the pain?"  (e.g., Scale 1-10; mild, moderate, or severe)    - MILD (1-3): doesn't interfere with normal activities     - MODERATE (4-7): interferes with normal activities or awakens from sleep    - SEVERE (8-10): excruciating pain, unable to do any normal activities       mild 7. CARDIAC RISK FACTORS: "Do you have any history of heart problems or risk factors for heart disease?" (e.g., prior heart attack, angina; high blood pressure, diabetes, being overweight, high cholesterol, smoking, or strong family history of heart disease)     overweight 8. PULMONARY RISK  FACTORS: "Do you have any history of lung disease?"  (e.g., blood clots in lung, asthma, emphysema, birth control pills)     no 9. CAUSE: "What do you think is causing the chest pain?"     Possible diverticulitis 10. OTHER SYMPTOMS: "Do you have any other symptoms?" (e.g., dizziness, nausea, vomiting, sweating, fever, difficulty breathing, cough)       Burps a lot. 11. PREGNANCY: "Is there any chance you are pregnant?" "When was your last menstrual period?"       na  Protocols used: CHEST PAIN-A-AH

## 2017-12-28 ENCOUNTER — Ambulatory Visit: Payer: 59 | Admitting: Family

## 2017-12-28 ENCOUNTER — Encounter: Payer: Self-pay | Admitting: Family

## 2017-12-28 VITALS — BP 114/73 | HR 80 | Temp 98.5°F | Resp 16 | Ht 68.0 in | Wt 231.0 lb

## 2017-12-28 DIAGNOSIS — R0789 Other chest pain: Secondary | ICD-10-CM | POA: Diagnosis not present

## 2017-12-28 DIAGNOSIS — R079 Chest pain, unspecified: Secondary | ICD-10-CM | POA: Diagnosis not present

## 2017-12-28 MED ORDER — OMEPRAZOLE 40 MG PO CPDR: 40.0000 mg | DELAYED_RELEASE_CAPSULE | Freq: Every day | ORAL | 3 refills | Status: DC

## 2017-12-28 MED FILL — CLENPIQ 10-3.5-12 MG-GM -GM: 10-3.5-12 M | 1 days supply | Qty: 320 | Fill #0

## 2017-12-28 MED FILL — OMEPRAZOLE 40 MG CPDR: 40 | 30 days supply | Qty: 30 | Fill #0

## 2017-12-28 NOTE — Patient Instructions (Addendum)
Please go to the ER if you develop severe chest pain or pain that lasts more that a minute or two. Begin prilosec.  You should be contacted about your referral to the cardiologist.

## 2017-12-28 NOTE — Progress Notes (Signed)
Subjective:    Patient ID: Chad Ward, male    DOB: December 30, 1975, 42 y.o.   MRN: [id number]  HPI   Patient is a 42 yr old male who presents today with chief complaint of chest pain. Father has hx of MI at age 55.   Reports that about one month ago he had some mild back pain.  Had "indigestion pain in my right shoulder." Then he had some "twingey pains" across his chestThen he was treated for diverticulitis. Reports that 1 week ago Sunday he has had "twingey pains."  This AM pain is  Located on the right side. 2-3/10.  Pain only lasts a few seconds at a time.  Sometimes center, sometimes left sometimes.  Off/on.  Pain is not worsened by deep breath.  Not worsened by exertion.  Denies unusual SOB other then his general deconditioning.  Reports that he has some voice changes/upper airway congestion after meals.  Does report some upper back pain at times.    Review of Systems See HPI  Past Medical History:  Diagnosis Date  . Allergy   . Colon polyp   . Diverticulitis   . GERD (gastroesophageal reflux disease)   . History of chicken pox   . IBS (irritable bowel syndrome)   . Meniere's disease      Social History   Socioeconomic History  . Marital status: Married    Spouse name: Not on file  . Number of children: 3  . Years of education: Not on file  . Highest education level: Not on file  Occupational History  . Not on file  Social Needs  . Financial resource strain: Not on file  . Food insecurity:    Worry: Not on file    Inability: Not on file  . Transportation needs:    Medical: Not on file    Non-medical: Not on file  Tobacco Use  . Smoking status: Never Smoker  . Smokeless tobacco: Never Used  Substance and Sexual Activity  . Alcohol use: No  . Drug use: No  . Sexual activity: Yes    Partners: Female  Lifestyle  . Physical activity:    Days per week: Not on file    Minutes per session: Not on file  . Stress: Not on file  Relationships  . Social connections:     Talks on phone: Not on file    Gets together: Not on file    Attends religious service: Not on file    Active member of club or organization: Not on file    Attends meetings of clubs or organizations: Not on file    Relationship status: Not on file  . Intimate partner violence:    Fear of current or ex partner: Not on file    Emotionally abused: Not on file    Physically abused: Not on file    Forced sexual activity: Not on file  Other Topics Concern  . 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 TONSILLECTOMY AND ADENOIDECTOMY  1988  . VASECTOMY N/A 09/26/2014   Procedure: VASECTOMY;  Surgeon: Hollice Espy, MD;  Location: [facility] ORS;  Service: Urology;  Laterality: N/A;  . WISDOM TOOTH EXTRACTION      Family History  Problem Relation Age of  Onset  . Hypertension Mother   . Diverticulitis Mother   . Colitis Mother   . Irritable bowel syndrome Mother   . Hypertension Father   . Heart attack Father   . Cancer Maternal Grandfather   . Diverticulitis Maternal Uncle   . Irritable bowel syndrome Maternal Aunt   . Prostate cancer Neg Hx   . Kidney cancer Neg Hx   . Bladder Cancer Neg Hx   . Hearing loss Neg Hx   . Colon cancer Neg Hx     No Known Allergies  Current Outpatient Medications on File Prior to Visit  Medication Sig Dispense Refill  . albuterol (PROVENTIL HFA;VENTOLIN HFA) 108 (90 Base) MCG/ACT inhaler Inhale 1-2 puffs into the lungs every 6 (six) hours as needed for wheezing or shortness of breath. 1 Inhaler 2  . calcium carbonate (TUMS - DOSED IN MG ELEMENTAL CALCIUM) 500 MG chewable tablet Chew 1 tablet by mouth as needed for indigestion or heartburn.    . fexofenadine (ALLEGRA) 60 MG tablet Take 60 mg by mouth daily.    . fluticasone (FLONASE) 50 MCG/ACT nasal spray Place 2 sprays  into both nostrils daily. (Patient taking differently: Place 2 sprays into both nostrils as needed. ) 16 g 6  . montelukast (SINGULAIR) 10 MG tablet Take 1 tablet (10 mg total) by mouth at bedtime. 30 tablet 3  . Sod Picosulfate-Mag Ox-Cit Acd (CLENPIQ) 10-3.5-12 MG-GM -GM/160ML SOLN Take 1 kit by mouth as directed. 2 Bottle 0   No current facility-administered medications on file prior to visit.     BP 114/73 (BP Location: Right Arm, Patient Position: Sitting, Cuff Size: Large)   Pulse 80   Temp 98.5 F (36.9 C) (Oral)   Resp 16   Ht 5' 8"  (1.727 m)   Wt 231 lb (104.8 kg)   SpO2 99%   BMI 35.12 kg/m       Objective:   Physical Exam  Constitutional: He is oriented to person, place, and time. He appears well-developed and well-nourished. No distress.  HENT:  Head: Normocephalic and atraumatic.  Cardiovascular: Normal rate and regular rhythm.  No murmur heard. Pulmonary/Chest: Effort normal and breath sounds normal. No respiratory distress. He has no wheezes. He has no rales.  Abdominal: There is no tenderness.  Musculoskeletal: He exhibits no edema.  No reproducible chest wall tenderness.   Neurological: He is alert and oriented to person, place, and time.  Skin: Skin is warm and dry.  Psychiatric: He has a normal mood and affect. His behavior is normal. Thought content normal.          Assessment & Plan:  Atypical chest pain- EKG tracing is personally reviewed.  EKG notes NSR.  No acute changes. Presentation does not suggest cardiac etiology, however he does have a family hx of CAD in his dad and he is overweight.  Will refer to cardiology for further evaluation and will give a trial of omeprazole once daily, refer to cardiology for further evaluation. Pt is advised to go to the ER if he develops severe/worsening pain or pain that lasts more than a minute or two.  I actually suspect that his pain may have a thoracic spine origin so we may want to try an nsaid if work up  unremarkable and no improvement with PPI.    Flu shot today.

## 2018-01-07 ENCOUNTER — Encounter: Payer: Self-pay | Admitting: Gastroenterology

## 2018-01-07 ENCOUNTER — Other Ambulatory Visit: Payer: Self-pay

## 2018-01-07 ENCOUNTER — Ambulatory Visit (AMBULATORY_SURGERY_CENTER): Payer: 59 | Admitting: Gastroenterology

## 2018-01-07 VITALS — BP 113/72 | HR 64 | Temp 98.6°F | Resp 11 | Ht 68.0 in | Wt 230.0 lb

## 2018-01-07 DIAGNOSIS — K639 Disease of intestine, unspecified: Secondary | ICD-10-CM

## 2018-01-07 DIAGNOSIS — Z1211 Encounter for screening for malignant neoplasm of colon: Secondary | ICD-10-CM | POA: Diagnosis not present

## 2018-01-07 DIAGNOSIS — K5732 Diverticulitis of large intestine without perforation or abscess without bleeding: Secondary | ICD-10-CM | POA: Diagnosis not present

## 2018-01-07 DIAGNOSIS — K5730 Diverticulosis of large intestine without perforation or abscess without bleeding: Secondary | ICD-10-CM

## 2018-01-07 DIAGNOSIS — D124 Benign neoplasm of descending colon: Secondary | ICD-10-CM

## 2018-01-07 DIAGNOSIS — K635 Polyp of colon: Secondary | ICD-10-CM

## 2018-01-07 MED ORDER — SODIUM CHLORIDE 0.9 % IV SOLN: 500.0000 mL | Freq: Once | INTRAVENOUS | Status: DC

## 2018-01-07 NOTE — Patient Instructions (Signed)
Handouts:  Polyps and Diverticulosis   YOU HAD AN ENDOSCOPIC PROCEDURE TODAY AT THE [facility]:   Refer to the procedure report that was given to you for any specific questions about what was found during the examination.  If the procedure report does not answer your questions, please call your gastroenterologist to clarify.  If you requested that your care partner not be given the details of your procedure findings, then the procedure report has been included in a sealed envelope for you to review at your convenience later.  YOU SHOULD EXPECT: Some feelings of bloating in the abdomen. Passage of more gas than usual.  Walking can help get rid of the air that was put into your GI tract during the procedure and reduce the bloating. If you had a lower endoscopy (such as a colonoscopy or flexible sigmoidoscopy) you may notice spotting of blood in your stool or on the toilet paper. If you underwent a bowel prep for your procedure, you may not have a normal bowel movement for a few days.  Please Note:  You might notice some irritation and congestion in your nose or some drainage.  This is from the oxygen used during your procedure.  There is no need for concern and it should clear up in a day or so.  SYMPTOMS TO REPORT IMMEDIATELY:   Following lower endoscopy (colonoscopy or flexible sigmoidoscopy):  Excessive amounts of blood in the stool  Significant tenderness or worsening of abdominal pains  Swelling of the abdomen that is new, acute  Fever of 100F or higher  For urgent or emergent issues, a gastroenterologist can be reached at any hour by calling [phone].   DIET:  We do recommend a small meal at first, but then you may proceed to your regular diet.  Drink plenty of fluids but you should avoid alcoholic beverages for 24 hours.  ACTIVITY:  You should plan to take it easy for the rest of today and you should NOT DRIVE or use heavy machinery until tomorrow (because of the  sedation medicines used during the test).    FOLLOW UP: Our staff will call the number listed on your records the next business day following your procedure to check on you and address any questions or concerns that you may have regarding the information given to you following your procedure. If we do not reach you, we will leave a message.  However, if you are feeling well and you are not experiencing any problems, there is no need to return our call.  We will assume that you have returned to your regular daily activities without incident.  If any biopsies were taken you will be contacted by phone or by letter within the next 1-3 weeks.  Please call us at [phone] if you have not heard about the biopsies in 3 weeks.    SIGNATURES/CONFIDENTIALITY: You and/or your care partner have signed paperwork which will be entered into your electronic medical record.  These signatures attest to the fact that that the information above on your After Visit Summary has been reviewed and is understood.  Full responsibility of the confidentiality of this discharge information lies with you and/or your care-partner. 

## 2018-01-07 NOTE — Op Note (Signed)
[place] Patient Name: Chad Ward Procedure Date: 01/07/2018 2:00 PM MRN: [id number] Endoscopist: Gerrit Heck , MD Age: 42 Referring MD:  Date of Birth: [birth year] Gender: Male Account #: [id number] Procedure:                Colonoscopy Indications:              Abdominal pain in the left lower quadrant,                            Follow-up of diverticulitis Medicines:                Monitored Anesthesia Care Procedure:                Pre-Anesthesia Assessment:                           - Prior to the procedure, a History and Physical                            was performed, and patient medications and                            allergies were reviewed. The patient's tolerance of                            previous anesthesia was also reviewed. The risks                            and benefits of the procedure and the sedation                            options and risks were discussed with the patient.                            All questions were answered, and informed consent                            was obtained. Prior Anticoagulants: The patient has                            taken no previous anticoagulant or antiplatelet                            agents. ASA Grade Assessment: II - A patient with                            mild systemic disease. After reviewing the risks                            and benefits, the patient was deemed in                            satisfactory condition to undergo the procedure.  After obtaining informed consent, the colonoscope                            was passed under direct vision. Throughout the                            procedure, the patient's blood pressure, pulse, and                            oxygen saturations were monitored continuously. The                            Colonoscope was introduced through the anus and                            advanced to the the cecum, identified by                             appendiceal orifice and ileocecal valve. The                            colonoscopy was performed without difficulty. The                            patient tolerated the procedure well. The quality                            of the bowel preparation was adequate. Scope In: 2:06:55 PM Scope Out: 2:26:19 PM Scope Withdrawal Time: 0 hours 15 minutes 35 seconds  Total Procedure Duration: 0 hours 19 minutes 24 seconds  Findings:                 The perianal and digital rectal examinations were                            normal.                           A 2 mm polyp was found in the sigmoid colon. The                            polyp was sessile. The polyp was removed with a                            cold biopsy forceps. Resection and retrieval were                            complete. Estimated blood loss was minimal.                           Multiple small and large-mouthed diverticula were                            found in the sigmoid colon. There was a mild amount  of localized erythema in an area of diverticulosis,                            located approximately 40 cm from the anal verge. No                            luminal narrowing or stricture noted. Biopsies were                            taken with a cold forceps for histology. Estimated                            blood loss was minimal.                           The ascending colon revealed moderately excessive                            looping. External pressure was applied to the                            patient's abdomen in order to accomplish the                            maneuver.                           The exam was otherwise normal throughout the                            examined colon.                           The retroflexed view of the distal rectum and anal                            verge was normal and showed no anal or rectal                             abnormalities. Complications:            No immediate complications. Estimated Blood Loss:     Estimated blood loss was minimal. Impression:               - One 2 mm polyp in the sigmoid colon, removed with                            a cold biopsy forceps. Resected and retrieved.                           - Diverticulosis in the sigmoid colon. There was a                            mild amount of localized erythema in an area of  diverticulosis, located approximately 40 cm from                            the anal verge. Biopsied.                           - There was significant looping of the colon.                           - The distal rectum and anal verge are normal on                            retroflexion view. Recommendation:           - Patient has a contact number available for                            emergencies. The signs and symptoms of potential                            delayed complications were discussed with the                            patient. Return to normal activities tomorrow.                            Written discharge instructions were provided to the                            patient.                           - Resume previous diet today.                           - Continue present medications.                           - Use fiber, for example Citrucel, Fibercon, Konsyl                            or Metamucil.                           - Repeat colonoscopy at age 33 for screening                            purposes.                           - Return to GI clinic PRN. Gerrit Heck, MD 01/07/2018 2:36:44 PM

## 2018-01-07 NOTE — Progress Notes (Signed)
History reviewed today  Patient states he drank 4 oz of water at  11:30 am after prep.  Discussed with Cherlynn Polo CRNA, ok to proceed with procedure at scheduled time.

## 2018-01-07 NOTE — Progress Notes (Signed)
Called to room to assist during endoscopic procedure.  Patient ID and intended procedure confirmed with present staff. Received instructions for my participation in the procedure from the performing physician.  

## 2018-01-07 NOTE — Progress Notes (Signed)
To PACU, VSS. Report to [email or web address] 

## 2018-01-10 ENCOUNTER — Telehealth: Payer: Self-pay

## 2018-01-10 NOTE — Telephone Encounter (Signed)
  Follow up Call-  Call back number 01/07/2018  Post procedure Call Back phone  # [phone]  Permission to leave phone message Yes  Some recent data might be hidden     Patient questions:  Do you have a fever, pain , or abdominal swelling? No. Pain Score  0 *  Have you tolerated food without any problems? Yes.    Have you been able to return to your normal activities? Yes.    Do you have any questions about your discharge instructions: Diet   No. Medications  No. Follow up visit  No.  Do you have questions or concerns about your Care? No.  Actions: * If pain score is 4 or above: No action needed, pain <4.

## 2018-01-11 NOTE — Progress Notes (Signed)
Cardiology Office Note:    Date:  01/12/2018   ID:  Chad Ward, DOB 03-10-1975, MRN [id number]  PCP:  [name], NP  Cardiologist:  Shirlee More, MD   Referring MD: [name], NP  ASSESSMENT:    1. Chest pain in adult   2. Familial hyperlipidemia    PLAN:    In order of problems listed above:  1. Atypical symptoms but high pretest probability of CAD with severe lipid disorder LDL greater than 190 and undergo further evaluation after discussion of options traditional stress test as opposed to cardiac CTA he prefers a CT scan which will be scheduled to see back my office in 6 weeks to review results.  He has CAD he will require additional medical therapy failure to respond referral for intervention PCI 2. Initiate a statin rosuvastatin 10 mg/day follow-up lipids 1 month  Next appointment 6 weeks   Medication Adjustments/Labs and Tests Ordered: Current medicines are reviewed at length with the patient today.  Concerns regarding medicines are outlined above.  Orders Placed This Encounter  Procedures  . CT CORONARY MORPH W/CTA COR W/SCORE W/CA W/CM &/OR WO/CM  . CT CORONARY FRACTIONAL FLOW RESERVE DATA PREP  . CT CORONARY FRACTIONAL FLOW RESERVE FLUID ANALYSIS  . Basic Metabolic Panel (BMET)  . EKG 12-Lead   Meds ordered this encounter  Medications  . rosuvastatin (CRESTOR) 10 MG tablet    Sig: Take 1 tablet (10 mg total) by mouth daily.    Dispense:  30 tablet    Refill:  3  . metoprolol tartrate (LOPRESSOR) 50 MG tablet    Sig: Take 1 tablet (50 mg total) by mouth once for 1 dose. Take 2 hours before cardiac CTA.    Dispense:  2 tablet    Refill:  0     Chief Complaint  Patient presents with  . Chest Pain    History of Present Illness:    Chad Ward is a 42 y.o. male who is being seen today for the evaluation of chest pain at the request of [name], NP. 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  He has no  background history of coronary congenital rheumatic heart disease but does have severe dyslipidemia at the baseline cholesterol of 290 LDL 194 HDL 39 triglycerides 337 consistent with high risk and/or familial hyperlipidemia.  September he had an episode of chest pain that occurred in the right chest initially indigestion subsequently more of a pressure sensation and waxed and waned radiate into his left chest and resolved after a week.  Since then he does have intermittent episodes of both pressure and sharp discomfort in the left chest it occurs for less than a minute with and without activity resolved without rest.  He also has had shortness of breath for activities more than usual but can climb sleep 3 flights of stairs without stopping there is no radiation of the discomfort into his shoulder or his neck it is not pleuritic no diaphoresis nausea or vomiting the initial discomfort was very severe subsequent episodes are mild.  Past Medical History:  Diagnosis Date  . Allergy   . Diverticulitis   . GERD (gastroesophageal reflux disease)   . History of chicken pox   . IBS (irritable bowel syndrome)   . Meniere's disease     Past Surgical History:  Procedure Laterality Date  . TONSILLECTOMY AND ADENOIDECTOMY  1988  . VASECTOMY N/A 09/26/2014   Procedure: VASECTOMY;  Surgeon: Hollice Espy,  MD;  Location: [facility] ORS;  Service: Urology;  Laterality: N/A;  . WISDOM TOOTH EXTRACTION      Current Medications: Current Meds  Medication Sig  . albuterol (PROVENTIL HFA;VENTOLIN HFA) 108 (90 Base) MCG/ACT inhaler Inhale 1-2 puffs into the lungs every 6 (six) hours as needed for wheezing or shortness of breath.  . calcium carbonate (TUMS - DOSED IN MG ELEMENTAL CALCIUM) 500 MG chewable tablet Chew 1 tablet by mouth as needed for indigestion or heartburn.  . fexofenadine (ALLEGRA) 60 MG tablet Take 60 mg by mouth daily.  . fluticasone (FLONASE) 50 MCG/ACT nasal spray Place 2 sprays into both nostrils  daily. (Patient taking differently: Place 2 sprays into both nostrils as needed. )  . montelukast (SINGULAIR) 10 MG tablet Take 1 tablet (10 mg total) by mouth at bedtime.  Marland Kitchen omeprazole (PRILOSEC) 40 MG capsule Take 1 capsule (40 mg total) by mouth daily.     Allergies:   Patient has no known allergies.   Social History   Socioeconomic History  . Marital status: Married    Spouse name: Not on file  . Number of children: 3  . Years of education: Not on file  . Highest education level: Not on file  Occupational History  . Not on file  Social Needs  . Financial resource strain: Not on file  . Food insecurity:    Worry: Not on file    Inability: Not on file  . Transportation needs:    Medical: Not on file    Non-medical: Not on file  Tobacco Use  . Smoking status: Never Smoker  . Smokeless tobacco: Never Used  Substance and Sexual Activity  . Alcohol use: No  . Drug use: No  . Sexual activity: Yes    Partners: Female  Lifestyle  . Physical activity:    Days per week: Not on file    Minutes per session: Not on file  . Stress: Not on file  Relationships  . Social connections:    Talks on phone: Not on file    Gets together: Not on file    Attends religious service: Not on file    Active member of club or organization: Not on file    Attends meetings of clubs or organizations: Not on file    Relationship status: Not on file  Other Topics Concern  . Not on file  Social History Narrative   Works as Civil Service fast streamer at [employer]   3 sons   Married   Completed bachelors   No pets   Enjoys movies     Family History: The patient's family history includes Cancer in his maternal grandfather; Colitis in his mother; Diverticulitis in his maternal uncle and mother; Heart attack in his father; Hypertension in his father and mother; Irritable bowel syndrome in his maternal aunt and mother; Stroke in his mother. There is no history of Prostate cancer, Kidney  cancer, Bladder Cancer, Hearing loss, Colon cancer, Esophageal cancer, Rectal cancer, or Stomach cancer.  ROS:   Review of Systems  Constitution: Negative.  HENT: Negative.   Eyes: Negative.   Cardiovascular: Positive for chest pain and dyspnea on exertion.  Respiratory: Negative.   Endocrine: Negative.   Hematologic/Lymphatic: Negative.   Skin: Negative.   Musculoskeletal: Positive for myalgias.  Gastrointestinal: Negative.   Genitourinary: Negative.   Neurological: Positive for dizziness.  Psychiatric/Behavioral: Negative.   Allergic/Immunologic: Negative.    Please see the history of present illness.     All  other systems reviewed and are negative.  EKGs/Labs/Other Studies Reviewed:    The following studies were reviewed today: Emergency room records reviewed prior to visit occluding independent review of his EKG  EKG:  EKG is  ordered today.  The ekg ordered today demonstrates sinus rhythm and is normal  Recent Labs: 07/30/2017: Hemoglobin 15.4; Platelets 274; TSH 2.40 10/31/2017: ALT 46; BUN 12; Creat 1.21; Potassium 4.5; Sodium 138  Recent Lipid Panel    Component Value Date/Time   CHOL 290 (H) 07/30/2017 1453   TRIG 337 (H) 07/30/2017 1453   HDL 39 (L) 07/30/2017 1453   CHOLHDL 7.4 (H) 07/30/2017 1453   LDLCALC 194 (H) 07/30/2017 1453    Physical Exam:    VS:  BP 116/80 (BP Location: Left Arm, Patient Position: Sitting, Cuff Size: Large)   Pulse 84   Ht 5\' 8"  (1.727 m)   Wt 234 lb (106.1 kg)   SpO2 98%   BMI 35.58 kg/m     Wt Readings from Last 3 Encounters:  01/12/18 234 lb (106.1 kg)  01/07/18 230 lb (104.3 kg)  12/28/17 231 lb (104.8 kg)     GEN: No xanthoma or xanthelasma well nourished, well developed in no acute distress HEENT: Normal NECK: No JVD; No carotid bruits LYMPHATICS: No lymphadenopathy CARDIAC: No chest wall tenderness RRR, no murmurs, rubs, gallops RESPIRATORY:  Clear to auscultation without rales, wheezing or rhonchi  ABDOMEN: Soft,  non-tender, non-distended MUSCULOSKELETAL:  No edema; No deformity  SKIN: Warm and dry NEUROLOGIC:  Alert and oriented x 3 PSYCHIATRIC:  Normal affect     Signed, Shirlee More, MD  01/12/2018 11:09 AM    [place]

## 2018-01-12 ENCOUNTER — Encounter: Payer: Self-pay | Admitting: Cardiology

## 2018-01-12 ENCOUNTER — Ambulatory Visit: Payer: 59 | Admitting: Cardiology

## 2018-01-12 VITALS — BP 116/80 | HR 84 | Ht 68.0 in | Wt 234.0 lb

## 2018-01-12 DIAGNOSIS — E7849 Other hyperlipidemia: Secondary | ICD-10-CM

## 2018-01-12 DIAGNOSIS — R079 Chest pain, unspecified: Secondary | ICD-10-CM | POA: Insufficient documentation

## 2018-01-12 MED ORDER — METOPROLOL TARTRATE 50 MG PO TABS: 50.0000 mg | ORAL_TABLET | Freq: Once | ORAL | 0 refills | Status: DC

## 2018-01-12 MED ORDER — ROSUVASTATIN CALCIUM 10 MG PO TABS: 10.0000 mg | ORAL_TABLET | Freq: Every day | ORAL | 3 refills | Status: DC

## 2018-01-12 MED FILL — ROSUVASTATIN CALCIUM 10 MG: 10 | 30 days supply | Qty: 30 | Fill #0

## 2018-01-12 MED FILL — METOPROLOL TARTRATE 50 MG T: 50 | 2 days supply | Qty: 2 | Fill #0

## 2018-01-12 NOTE — Patient Instructions (Signed)
Medication Instructions:  Your physician has recommended you make the following change in your medication:   START rosuvastatin (crestor) 10 mg: Take 1 tablet daily  If you need a refill on your cardiac medications before your next appointment, please call your pharmacy.   Lab work: Your physician recommends that you return for lab work within 3-7 days before your cardiac CTA: BMP. Please return to our office for lab work, no appointment needed. No need to fast beforehand.   If you have labs (blood work) drawn today and your tests are completely normal, you will receive your results only by: Marland Kitchen MyChart Message (if you have MyChart) OR . A paper copy in the mail If you have any lab test that is abnormal or we need to change your treatment, we will call you to review the results.  Testing/Procedures: You had an EKG today.   Your physician has requested that you have cardiac CT. Cardiac computed tomography (CT) is a painless test that uses an x-ray machine to take clear, detailed pictures of your heart. For further information please visit [email or web address]. Please follow instruction sheet as given.  Please arrive at the [facility] main entrance of [facility] at xx:xx AM (30-45 minutes prior to test start time)  [facility] [place], [place] [address] [phone]  Proceed to the [facility] Radiology Department (First Floor).  Please follow these instructions carefully (unless otherwise directed):  Hold all erectile dysfunction medications at least 48 hours prior to test.  On the Night Before the Test: . Be sure to Drink plenty of water. . Do not consume any caffeinated/decaffeinated beverages or chocolate 12 hours prior to your test. . Do not take any antihistamines 12 hours prior to your test.   On the Day of the Test: . Drink plenty of water. Do not drink any water within one hour of the test. . Do not eat any food 4 hours prior to the  test. . You may take your regular medications prior to the test.  . Take metoprolol (Lopressor) two hours prior to test.   *For Clinical Staff only. Please instruct patient the following:*        -Drink plenty of water       -Hold Furosemide/hydrochlorothiazide morning of the test       -Take metoprolol (Lopressor) 2 hours prior to test (if applicable).                  -If HR is less than 55 BPM- No Beta Blocker                -IF HR is greater than 55 BPM and patient is less than or equal to 41 yrs old Lopressor 100mg  x1.                -If HR is greater than 55 BPM and patient is greater than 41 yrs old Lopressor 50 mg x1.      After the Test: . Drink plenty of water. . After receiving IV contrast, you may experience a mild flushed feeling. This is normal. . On occasion, you may experience a mild rash up to 24 hours after the test. This is not dangerous. If this occurs, you can take Benadryl 25 mg and increase your fluid intake. . If you experience trouble breathing, this can be serious. If it is severe call 911 IMMEDIATELY. If it is mild, please call our office.   Follow-Up: At  [facility], you and your health needs are our priority.  As part of our continuing mission to provide you with exceptional heart care, we have created designated Provider Care Teams.  These Care Teams include your primary Cardiologist (physician) and Advanced Practice Providers (APPs -  Physician Assistants and Nurse Practitioners) who all work together to provide you with the care you need, when you need it. You will need a follow up appointment in 6 weeks.      Cardiac CT Angiogram A cardiac CT angiogram is a procedure to look at the heart and the area around the heart. It may be done to help find the cause of chest pains or other symptoms of heart disease. During this procedure, a large X-ray machine, called a CT scanner, takes detailed pictures of the heart and the surrounding area after a dye  (contrast material) has been injected into blood vessels in the area. The procedure is also sometimes called a coronary CT angiogram, coronary artery scanning, or CTA. A cardiac CT angiogram allows the health care provider to see how well blood is flowing to and from the heart. The health care provider will be able to see if there are any problems, such as:  Blockage or narrowing of the coronary arteries in the heart.  Fluid around the heart.  Signs of weakness or disease in the muscles, valves, and tissues of the heart.  Tell a health care provider about:  Any allergies you have. This is especially important if you have had a previous allergic reaction to contrast dye.  All medicines you are taking, including vitamins, herbs, eye drops, creams, and over-the-counter medicines.  Any blood disorders you have.  Any surgeries you have had.  Any medical conditions you have.  Whether you are pregnant or may be pregnant.  Any anxiety disorders, chronic pain, or other conditions you have that may increase your stress or prevent you from lying still. What are the risks? Generally, this is a safe procedure. However, problems may occur, including:  Bleeding.  Infection.  Allergic reactions to medicines or dyes.  Damage to other structures or organs.  Kidney damage from the dye or contrast that is used.  Increased risk of cancer from radiation exposure. This risk is low. Talk with your health care provider about: ? The risks and benefits of testing. ? How you can receive the lowest dose of radiation.  What happens before the procedure?  Wear comfortable clothing and remove any jewelry, glasses, dentures, and hearing aids.  Follow instructions from your health care provider about eating and drinking. This may include: ? For 12 hours before the test - avoid caffeine. This includes tea, coffee, soda, energy drinks, and diet pills. Drink plenty of water or other fluids that do not have  caffeine in them. Being well-hydrated can prevent complications. ? For 4-6 hours before the test - stop eating and drinking. The contrast dye can cause nausea, but this is less likely if your stomach is empty.  Ask your health care provider about changing or stopping your regular medicines. This is especially important if you are taking diabetes medicines, blood thinners, or medicines to treat erectile dysfunction. What happens during the procedure?  Hair on your chest may need to be removed so that small sticky patches called electrodes can be placed on your chest. These will transmit information that helps to monitor your heart during the test.  An IV tube will be inserted into one of your veins.  You  might be given a medicine to control your heart rate during the test. This will help to ensure that good images are obtained.  You will be asked to lie on an exam table. This table will slide in and out of the CT machine during the procedure.  Contrast dye will be injected into the IV tube. You might feel warm, or you may get a metallic taste in your mouth.  You will be given a medicine (nitroglycerin) to relax (dilate) the arteries in your heart.  The table that you are lying on will move into the CT machine tunnel for the scan.  The person running the machine will give you instructions while the scans are being done. You may be asked to: ? Keep your arms above your head. ? Hold your breath. ? Stay very still, even if the table is moving.  When the scanning is complete, you will be moved out of the machine.  The IV tube will be removed. The procedure may vary among health care providers and hospitals. What happens after the procedure?  You might feel warm, or you may get a metallic taste in your mouth from the contrast dye.  You may have a headache from the nitroglycerin.  After the procedure, drink water or other fluids to wash (flush) the contrast material out of your  body.  Contact a health care provider if you have any symptoms of allergy to the contrast. These symptoms include: ? Shortness of breath. ? Rash or hives. ? A racing heartbeat.  Most people can return to their normal activities right after the procedure. Ask your health care provider what activities are safe for you.  It is up to you to get the results of your procedure. Ask your health care provider, or the department that is doing the procedure, when your results will be ready. Summary  A cardiac CT angiogram is a procedure to look at the heart and the area around the heart. It may be done to help find the cause of chest pains or other symptoms of heart disease.  During this procedure, a large X-ray machine, called a CT scanner, takes detailed pictures of the heart and the surrounding area after a dye (contrast material) has been injected into blood vessels in the area.  Ask your health care provider about changing or stopping your regular medicines before the procedure. This is especially important if you are taking diabetes medicines, blood thinners, or medicines to treat erectile dysfunction.  After the procedure, drink water or other fluids to wash (flush) the contrast material out of your body. This information is not intended to replace advice given to you by your health care provider. Make sure you discuss any questions you have with your health care provider. Document Released: 01/23/2008 Document Revised: 12/30/2015 Document Reviewed: 12/30/2015 Elsevier Interactive Patient Education  2017 [place] Heart-healthy meal planning includes:  Limiting unhealthy fats.  Increasing healthy fats.  Making other small dietary changes.  You may need to talk with your doctor or a diet specialist (dietitian) to create an eating plan that is right for you. What types of fat should I choose?  Choose healthy fats. These include olive oil and canola  oil, flaxseeds, walnuts, almonds, and seeds.  Eat more omega-3 fats. These include salmon, mackerel, sardines, tuna, flaxseed oil, and ground flaxseeds. Try to eat fish at least twice each week.  Limit saturated fats. ? Saturated fats are often found in  animal products, such as meats, butter, and cream. ? Plant sources of saturated fats include palm oil, palm kernel oil, and coconut oil.  Avoid foods with partially hydrogenated oils in them. These include stick margarine, some tub margarines, cookies, crackers, and other baked goods. These contain trans fats. What general guidelines do I need to follow?  Check food labels carefully. Identify foods with trans fats or high amounts of saturated fat.  Fill one half of your plate with vegetables and green salads. Eat 4-5 servings of vegetables per day. A serving of vegetables is: ? 1 cup of raw leafy vegetables. ?  cup of raw or cooked cut-up vegetables. ?  cup of vegetable juice.  Fill one fourth of your plate with whole grains. Look for the word "whole" as the first word in the ingredient list.  Fill one fourth of your plate with lean protein foods.  Eat 4-5 servings of fruit per day. A serving of fruit is: ? One medium whole fruit. ?  cup of dried fruit. ?  cup of fresh, frozen, or canned fruit. ?  cup of 100% fruit juice.  Eat more foods that contain soluble fiber. These include apples, broccoli, carrots, beans, peas, and barley. Try to get 20-30 g of fiber per day.  Eat more home-cooked food. Eat less restaurant, buffet, and fast food.  Limit or avoid alcohol.  Limit foods high in starch and sugar.  Avoid fried foods.  Avoid frying your food. Try baking, boiling, grilling, or broiling it instead. You can also reduce fat by: ? Removing the skin from poultry. ? Removing all visible fats from meats. ? Skimming the fat off of stews, soups, and gravies before serving them. ? Steaming vegetables in water or broth.  Lose  weight if you are overweight.  Eat 4-5 servings of nuts, legumes, and seeds per week: ? One serving of dried beans or legumes equals  cup after being cooked. ? One serving of nuts equals 1 ounces. ? One serving of seeds equals  ounce or one tablespoon.  You may need to keep track of how much salt or sodium you eat. This is especially true if you have high blood pressure. Talk with your doctor or dietitian to get more information. What foods can I eat? Grains Breads, including [place], white, pita, wheat, raisin, rye, oatmeal, and [place]. Tortillas that are neither fried nor made with lard or trans fat. Low-fat rolls, including hotdog and hamburger buns and English muffins. Biscuits. Muffins. Waffles. Pancakes. Light popcorn. Whole-grain cereals. Flatbread. Melba toast. Pretzels. Breadsticks. Rusks. Low-fat snacks. Low-fat crackers, including oyster, saltine, matzo, graham, animal, and rye. Rice and pasta, including brown rice and pastas that are made with whole wheat. Vegetables All vegetables. Fruits All fruits, but limit coconut. Meats and Other Protein Sources Lean, well-trimmed beef, veal, pork, and lamb. Chicken and [place] without skin. All fish and shellfish. Wild duck, rabbit, pheasant, and venison. Egg whites or low-cholesterol egg substitutes. Dried beans, peas, lentils, and tofu. Seeds and most nuts. Dairy Low-fat or nonfat cheeses, including ricotta, string, and mozzarella. Skim or 1% milk that is liquid, powdered, or evaporated. Buttermilk that is made with low-fat milk. Nonfat or low-fat yogurt. Beverages Mineral water. Diet carbonated beverages. Sweets and Desserts Sherbets and fruit ices. Honey, jam, marmalade, jelly, and syrups. Meringues and gelatins. Pure sugar candy, such as hard candy, jelly beans, gumdrops, mints, marshmallows, and small amounts of dark chocolate. [employer] Eat all sweets and desserts in moderation. Fats  and Oils Nonhydrogenated (trans-free)  margarines. Vegetable oils, including soybean, sesame, sunflower, olive, peanut, safflower, corn, canola, and cottonseed. Salad dressings or mayonnaise made with a vegetable oil. Limit added fats and oils that you use for cooking, baking, salads, and as spreads. Other Cocoa powder. Coffee and tea. All seasonings and condiments. The items listed above may not be a complete list of recommended foods or beverages. Contact your dietitian for more options. What foods are not recommended? Grains Breads that are made with saturated or trans fats, oils, or whole milk. Croissants. Butter rolls. Cheese breads. Sweet rolls. Donuts. Buttered popcorn. Chow mein noodles. High-fat crackers, such as cheese or butter crackers. Meats and Other Protein Sources Fatty meats, such as hotdogs, short ribs, sausage, spareribs, bacon, rib eye roast or steak, and mutton. High-fat deli meats, such as salami and bologna. Caviar. Domestic duck and goose. Organ meats, such as kidney, liver, sweetbreads, and heart. Dairy Cream, sour cream, cream cheese, and creamed cottage cheese. Whole-milk cheeses, including blue (bleu), Monterey Jack, [place], [place], American, [place], Swiss, cheddar, [place], and [place]. Whole or 2% milk that is liquid, evaporated, or condensed. Whole buttermilk. Cream sauce or high-fat cheese sauce. Yogurt that is made from whole milk. Beverages Regular sodas and juice drinks with added sugar. Sweets and Desserts Frosting. Pudding. Cookies. Cakes other than angel food cake. Candy that has milk chocolate or white chocolate, hydrogenated fat, butter, coconut, or unknown ingredients. Buttered syrups. Full-fat ice cream or ice cream drinks. Fats and Oils Gravy that has suet, meat fat, or shortening. Cocoa butter, hydrogenated oils, palm oil, coconut oil, palm kernel oil. These can often be found in baked products, candy, fried foods, nondairy creamers, and whipped toppings. Solid fats and shortenings, including  bacon fat, salt pork, lard, and butter. Nondairy cream substitutes, such as coffee creamers and sour cream substitutes. Salad dressings that are made of unknown oils, cheese, or sour cream. The items listed above may not be a complete list of foods and beverages to avoid. Contact your dietitian for more information. This information is not intended to replace advice given to you by your health care provider. Make sure you discuss any questions you have with your health care provider. Document Released: 08/11/2011 Document Revised: 07/18/2015 Document Reviewed: 08/03/2013 Elsevier Interactive Patient Education  2018 [place].   Rosuvastatin Tablets What is this medicine? ROSUVASTATIN (roe SOO va sta tin) is known as a HMG-CoA reductase inhibitor or 'statin'. It lowers cholesterol and triglycerides in the blood. This drug may also reduce the risk of heart attack, stroke, or other health problems in patients with risk factors for heart disease. Diet and lifestyle changes are often used with this drug. This medicine may be used for other purposes; ask your health care provider or pharmacist if you have questions. COMMON BRAND NAME(S): Crestor What should I tell my health care provider before I take this medicine? They need to know if you have any of these conditions: -frequently drink alcoholic beverages -kidney disease -liver disease -muscle aches or weakness -other medical condition -an unusual or allergic reaction to rosuvastatin, other medicines, foods, dyes, or preservatives -pregnant or trying to get pregnant -breast-feeding How should I use this medicine? Take this medicine by mouth with a glass of water. Follow the directions on the prescription label. Do not cut, crush or chew this medicine. You can take this medicine with or without food. Take your doses at regular intervals. Do not take your medicine more often than directed. Talk to your  pediatrician regarding the use of this  medicine in children. While this drug may be prescribed for children as young as 87 years old for selected conditions, precautions do apply. Overdosage: If you think you have taken too much of this medicine contact a poison control center or emergency room at once. NOTE: This medicine is only for you. Do not share this medicine with others. What if I miss a dose? If you miss a dose, take it as soon as you can. Do not take 2 doses within 12 hours of each other. If there are less than 12 hours until your next dose, take only that dose. Do not take double or extra doses. What may interact with this medicine? Do not take this medicine with any of the following medications: -herbal medicines like red yeast rice This medicine may also interact with the following medications: -alcohol -antacids containing aluminum hydroxide or magnesium hydroxide -cyclosporine -other medicines for high cholesterol -some medicines for HIV infection -warfarin This list may not describe all possible interactions. Give your health care provider a list of all the medicines, herbs, non-prescription drugs, or dietary supplements you use. Also tell them if you smoke, drink alcohol, or use illegal drugs. Some items may interact with your medicine. What should I watch for while using this medicine? Visit your doctor or health care professional for regular check-ups. You may need regular tests to make sure your liver is working properly. Tell your doctor or health care professional right away if you get any unexplained muscle pain, tenderness, or weakness, especially if you also have a fever and tiredness. Your doctor or health care professional may tell you to stop taking this medicine if you develop muscle problems. If your muscle problems do not go away after stopping this medicine, contact your health care professional. This medicine may affect blood sugar levels. If you have diabetes, check with your doctor or health care  professional before you change your diet or the dose of your diabetic medicine. Avoid taking antacids containing aluminum, calcium or magnesium within 2 hours of taking this medicine. This drug is only part of a total heart-health program. Your doctor or a dietician can suggest a low-cholesterol and low-fat diet to help. Avoid alcohol and smoking, and keep a proper exercise schedule. Do not use this drug if you are pregnant or breast-feeding. Serious side effects to an unborn child or to an infant are possible. Talk to your doctor or pharmacist for more information. What side effects may I notice from receiving this medicine? Side effects that you should report to your doctor or health care professional as soon as possible: -allergic reactions like skin rash, itching or hives, swelling of the face, lips, or tongue -dark urine -fever -joint pain -muscle cramps, pain -redness, blistering, peeling or loosening of the skin, including inside the mouth -trouble passing urine or change in the amount of urine -unusually weak or tired -yellowing of the eyes or skin Side effects that usually do not require medical attention (report to your doctor or health care professional if they continue or are bothersome): -constipation -heartburn -nausea -stomach gas, pain, upset This list may not describe all possible side effects. Call your doctor for medical advice about side effects. You may report side effects to FDA at 1-800-FDA-1088. Where should I keep my medicine? Keep out of the reach of children. Store at room temperature between 20 and 25 degrees C (68 and 77 degrees F). Keep container tightly closed (protect from moisture). Throw  away any unused medicine after the expiration date. NOTE: This sheet is a summary. It may not cover all possible information. If you have questions about this medicine, talk to your doctor, pharmacist, or health care provider.  2018 Elsevier/Gold Standard (2014-07-26  13:33:08)

## 2018-01-17 MED FILL — MONTELUKAST SOD 10 MG TAB: 10 | 30 days supply | Qty: 30 | Fill #2

## 2018-01-18 ENCOUNTER — Encounter: Payer: Self-pay | Admitting: Gastroenterology

## 2018-02-04 ENCOUNTER — Ambulatory Visit (HOSPITAL_COMMUNITY): Admission: RE | Admit: 2018-02-04 | Payer: 59 | Source: Ambulatory Visit

## 2018-02-04 ENCOUNTER — Ambulatory Visit (HOSPITAL_COMMUNITY)
Admission: RE | Admit: 2018-02-04 | Discharge: 2018-02-04 | Disposition: A | Discharge status: Home / Self Care | Payer: 59 | Source: Ambulatory Visit | Attending: Cardiology | Admitting: Cardiology

## 2018-02-04 DIAGNOSIS — R079 Chest pain, unspecified: Secondary | ICD-10-CM

## 2018-02-04 MED ORDER — NITROGLYCERIN 0.4 MG SL SUBL
SUBLINGUAL_TABLET | SUBLINGUAL | Status: AC
  Filled 2018-02-04: qty 2

## 2018-02-04 MED ORDER — NITROGLYCERIN 0.4 MG SL SUBL
0.8000 mg | SUBLINGUAL_TABLET | Freq: Once | SUBLINGUAL | Status: AC
  Administered 2018-02-04: 0.8 mg via SUBLINGUAL
  Filled 2018-02-04: qty 25

## 2018-02-04 MED ORDER — IOPAMIDOL (ISOVUE-370) INJECTION 76%
100.0000 mL | Freq: Once | INTRAVENOUS | Status: AC | PRN
  Administered 2018-02-04: 90 mL via INTRAVENOUS

## 2018-02-23 NOTE — Progress Notes (Signed)
Cardiology Office Note:    Date:  02/24/2018   ID:  Chad Ward, DOB 27-May-1975, MRN [id number]  PCP:  [name], NP  Cardiologist:  Shirlee More, MD    Referring MD: [name], NP    ASSESSMENT:    1. Chest pain, unspecified type   2. Dyslipidemia    PLAN:    In order of problems listed above:  1. He is improved reassured by normal CTA but continues to have momentary episodes of localized left chest discomfort without an obvious precipitant.  He is not having heartburn indigestion or esophageal symptoms at this time I would just use reassurance stop his beta-blocker.  If symptoms worsen or more typical may benefit from an esophageal evaluation 2. Despite a calcium score of 0 and continue with statin with an LDL baseline approaching 200 will require follow-up labs with his PCP liver function lipid profile   Next appointment: As needed   Medication Adjustments/Labs and Tests Ordered: Current medicines are reviewed at length with the patient today.  Concerns regarding medicines are outlined above.  No orders of the defined types were placed in this encounter.  No orders of the defined types were placed in this encounter.   Chief Complaint  Patient presents with  . Follow-up    after CTA    History of Present Illness:    Chad Ward is a 43 y.o. male with a hx of hyperlipidemia and chest pain last seen 01/12/18.  Cardiac CTA: IMPRESSION: 1. Coronary artery calcium score 0 Agatston units, suggesting low risk for future cardiac events. 2. No significant coronary artery disease.  Compliance with diet, lifestyle and medications: Yes  Overall improved but still gets momentary episodes of brief stabbing chest discomfort.  He feels it is due to stress related to his occupation and vocation.  No shortness of breath exertional chest pain palpitation or syncope. Past Medical History:  Diagnosis Date  . Allergy   . Diverticulitis   . GERD  (gastroesophageal reflux disease)   . History of chicken pox   . IBS (irritable bowel syndrome)   . Meniere's disease     Past Surgical History:  Procedure Laterality Date  . TONSILLECTOMY AND ADENOIDECTOMY  1988  . VASECTOMY N/A 09/26/2014   Procedure: VASECTOMY;  Surgeon: Hollice Espy, MD;  Location: [facility] ORS;  Service: Urology;  Laterality: N/A;  . WISDOM TOOTH EXTRACTION      Current Medications: Current Meds  Medication Sig  . albuterol (PROVENTIL HFA;VENTOLIN HFA) 108 (90 Base) MCG/ACT inhaler Inhale 1-2 puffs into the lungs every 6 (six) hours as needed for wheezing or shortness of breath.  . calcium carbonate (TUMS - DOSED IN MG ELEMENTAL CALCIUM) 500 MG chewable tablet Chew 1 tablet by mouth as needed for indigestion or heartburn.  . fexofenadine (ALLEGRA) 60 MG tablet Take 60 mg by mouth daily.  . fluticasone (FLONASE) 50 MCG/ACT nasal spray Place 2 sprays into both nostrils daily. (Patient taking differently: Place 2 sprays into both nostrils as needed. )  . montelukast (SINGULAIR) 10 MG tablet Take 1 tablet (10 mg total) by mouth at bedtime.  Marland Kitchen omeprazole (PRILOSEC) 40 MG capsule Take 1 capsule (40 mg total) by mouth daily.  . rosuvastatin (CRESTOR) 10 MG tablet Take 1 tablet (10 mg total) by mouth daily.     Allergies:   Patient has no known allergies.   Social History   Socioeconomic History  . Marital status: Married    Spouse name: Not on  file  . Number of children: 3  . Years of education: Not on file  . Highest education level: Not on file  Occupational History  . Not on file  Social Needs  . Financial resource strain: Not on file  . Food insecurity:    Worry: Not on file    Inability: Not on file  . Transportation needs:    Medical: Not on file    Non-medical: Not on file  Tobacco Use  . Smoking status: Never Smoker  . Smokeless tobacco: Never Used  Substance and Sexual Activity  . Alcohol use: No  . Drug use: No  . Sexual activity: Yes     Partners: Female  Lifestyle  . Physical activity:    Days per week: Not on file    Minutes per session: Not on file  . Stress: Not on file  Relationships  . Social connections:    Talks on phone: Not on file    Gets together: Not on file    Attends religious service: Not on file    Active member of club or organization: Not on file    Attends meetings of clubs or organizations: Not on file    Relationship status: Not on file  Other Topics Concern  . Not on file  Social History Narrative   Works as Civil Service fast streamer at [employer]   3 sons   Married   Completed bachelors   No pets   Enjoys movies     Family History: The patient's family history includes Cancer in his maternal grandfather; Colitis in his mother; Diverticulitis in his maternal uncle and mother; Heart attack in his father; Hypertension in his father and mother; Irritable bowel syndrome in his maternal aunt and mother; Stroke in his mother. There is no history of Prostate cancer, Kidney cancer, Bladder Cancer, Hearing loss, Colon cancer, Esophageal cancer, Rectal cancer, or Stomach cancer. ROS:   Please see the history of present illness.    All other systems reviewed and are negative.  EKGs/Labs/Other Studies Reviewed:    The following studies were reviewed today:   Recent Labs: 07/30/2017: Hemoglobin 15.4; Platelets 274; TSH 2.40 10/31/2017: ALT 46; BUN 12; Creat 1.21; Potassium 4.5; Sodium 138  Recent Lipid Panel    Component Value Date/Time   CHOL 290 (H) 07/30/2017 1453   TRIG 337 (H) 07/30/2017 1453   HDL 39 (L) 07/30/2017 1453   CHOLHDL 7.4 (H) 07/30/2017 1453   LDLCALC 194 (H) 07/30/2017 1453    Physical Exam:    VS:  BP 116/86 (BP Location: Right Arm, Patient Position: Sitting, Cuff Size: Large)   Pulse 74   Ht 5\' 8"  (1.727 m)   Wt 235 lb 1.9 oz (106.6 kg)   SpO2 98%   BMI 35.75 kg/m     Wt Readings from Last 3 Encounters:  02/24/18 235 lb 1.9 oz (106.6 kg)  01/12/18 234 lb  (106.1 kg)  01/07/18 230 lb (104.3 kg)     GEN:  Well nourished, well developed in no acute distress HEENT: Normal NECK: No JVD; No carotid bruits LYMPHATICS: No lymphadenopathy CARDIAC: RRR, no murmurs, rubs, gallops RESPIRATORY:  Clear to auscultation without rales, wheezing or rhonchi  ABDOMEN: Soft, non-tender, non-distended MUSCULOSKELETAL:  No edema; No deformity  SKIN: Warm and dry NEUROLOGIC:  Alert and oriented x 3 PSYCHIATRIC:  Normal affect    Signed, Shirlee More, MD  02/24/2018 8:54 AM    [place]

## 2018-02-24 ENCOUNTER — Encounter: Payer: Self-pay | Admitting: Cardiology

## 2018-02-24 ENCOUNTER — Ambulatory Visit (INDEPENDENT_AMBULATORY_CARE_PROVIDER_SITE_OTHER): Payer: 59 | Admitting: Cardiology

## 2018-02-24 VITALS — BP 116/86 | HR 74 | Ht 68.0 in | Wt 235.1 lb

## 2018-02-24 DIAGNOSIS — R079 Chest pain, unspecified: Secondary | ICD-10-CM | POA: Diagnosis not present

## 2018-02-24 DIAGNOSIS — E785 Hyperlipidemia, unspecified: Secondary | ICD-10-CM | POA: Diagnosis not present

## 2018-02-24 MED FILL — OMEPRAZOLE 40 MG CPDR: 40 | 30 days supply | Qty: 30 | Fill #0

## 2018-02-24 NOTE — Patient Instructions (Signed)
Medication Instructions:  Your physician recommends that you continue on your current medications as directed. Please refer to the Current Medication list given to you today.  If you need a refill on your cardiac medications before your next appointment, please call your pharmacy.   Lab work: None  If you have labs (blood work) drawn today and your tests are completely normal, you will receive your results only by: . MyChart Message (if you have MyChart) OR . A paper copy in the mail If you have any lab test that is abnormal or we need to change your treatment, we will call you to review the results.  Testing/Procedures: None   Follow-Up: At [facility], you and your health needs are our priority.  As part of our continuing mission to provide you with exceptional heart care, we have created designated Provider Care Teams.  These Care Teams include your primary Cardiologist (physician) and Advanced Practice Providers (APPs -  Physician Assistants and Nurse Practitioners) who all work together to provide you with the care you need, when you need it. You will need a follow up appointment as needed if symptoms worsen or fail to improve.    

## 2018-02-24 NOTE — Addendum Note (Signed)
Addended by: Austin Miles on: 02/24/2018 08:59 AM   Modules accepted: Orders

## 2018-02-25 DIAGNOSIS — H5203 Hypermetropia, bilateral: Secondary | ICD-10-CM | POA: Diagnosis not present

## 2018-03-02 DIAGNOSIS — M542 Cervicalgia: Secondary | ICD-10-CM | POA: Diagnosis not present

## 2018-03-02 DIAGNOSIS — M545 Low back pain: Secondary | ICD-10-CM | POA: Diagnosis not present

## 2018-03-09 DIAGNOSIS — M9901 Segmental and somatic dysfunction of cervical region: Secondary | ICD-10-CM | POA: Diagnosis not present

## 2018-03-09 DIAGNOSIS — M542 Cervicalgia: Secondary | ICD-10-CM | POA: Diagnosis not present

## 2018-03-09 DIAGNOSIS — M40292 Other kyphosis, cervical region: Secondary | ICD-10-CM | POA: Diagnosis not present

## 2018-03-09 DIAGNOSIS — M62838 Other muscle spasm: Secondary | ICD-10-CM | POA: Diagnosis not present

## 2018-03-10 DIAGNOSIS — M542 Cervicalgia: Secondary | ICD-10-CM | POA: Diagnosis not present

## 2018-03-10 DIAGNOSIS — M9901 Segmental and somatic dysfunction of cervical region: Secondary | ICD-10-CM | POA: Diagnosis not present

## 2018-03-10 DIAGNOSIS — M62838 Other muscle spasm: Secondary | ICD-10-CM | POA: Diagnosis not present

## 2018-03-10 DIAGNOSIS — M40292 Other kyphosis, cervical region: Secondary | ICD-10-CM | POA: Diagnosis not present

## 2018-03-14 ENCOUNTER — Encounter: Payer: Self-pay | Admitting: Family

## 2018-03-14 ENCOUNTER — Ambulatory Visit: Payer: 59 | Admitting: Family

## 2018-03-14 ENCOUNTER — Other Ambulatory Visit: Payer: Self-pay | Admitting: Family

## 2018-03-14 VITALS — BP 108/70 | HR 70 | Temp 98.5°F | Resp 16 | Ht 68.0 in | Wt 236.0 lb

## 2018-03-14 DIAGNOSIS — M542 Cervicalgia: Secondary | ICD-10-CM | POA: Diagnosis not present

## 2018-03-14 DIAGNOSIS — E785 Hyperlipidemia, unspecified: Secondary | ICD-10-CM

## 2018-03-14 DIAGNOSIS — R519 Headache, unspecified: Secondary | ICD-10-CM

## 2018-03-14 DIAGNOSIS — R51 Headache: Secondary | ICD-10-CM | POA: Diagnosis not present

## 2018-03-14 DIAGNOSIS — M62838 Other muscle spasm: Secondary | ICD-10-CM | POA: Diagnosis not present

## 2018-03-14 DIAGNOSIS — M9901 Segmental and somatic dysfunction of cervical region: Secondary | ICD-10-CM | POA: Diagnosis not present

## 2018-03-14 DIAGNOSIS — H6502 Acute serous otitis media, left ear: Secondary | ICD-10-CM

## 2018-03-14 DIAGNOSIS — M40292 Other kyphosis, cervical region: Secondary | ICD-10-CM | POA: Diagnosis not present

## 2018-03-14 DIAGNOSIS — R0789 Other chest pain: Secondary | ICD-10-CM | POA: Diagnosis not present

## 2018-03-14 LAB — LIPID PANEL
Cholesterol: 208 mg/dL — ABNORMAL HIGH (ref 0–200)
HDL: 40.7 mg/dL (ref >39.00)
LDL CALC: 134 mg/dL — AB (ref 0–99)
NONHDL: 167.48
Total CHOL/HDL Ratio: 5
Triglycerides: 169 mg/dL — ABNORMAL HIGH (ref 0.0–149.0)
VLDL: 33.8 mg/dL (ref 0.0–40.0)

## 2018-03-14 MED ORDER — LORATADINE-PSEUDOEPHEDRINE ER 5-120 MG PO TB12: 1.0000 | ORAL_TABLET | Freq: Two times a day (BID) | ORAL | Status: DC

## 2018-03-14 MED ORDER — FLUTICASONE PROPIONATE 50 MCG/ACT NA SUSP: 2.0000 | Freq: Every day | NASAL | 6 refills | Status: DC

## 2018-03-14 MED FILL — FLUTICASONE PROP 50 MCG SPR: 50 | 30 days supply | Qty: 16 | Fill #0

## 2018-03-14 NOTE — Progress Notes (Addendum)
Subjective:    Patient ID: Chad Ward, male    DOB: 1975-05-25, 43 y.o.   MRN: [id number]  HPI  Patient is a 43 year old male who presents today for routine follow-up. We saw him back in early November with atypical chest pain.  EKG was normal however he had a family history of coronary artery disease in his father and was referred to cardiology for further evaluation.  We did give him a trial of Prilosec.  He was seen by cardiology and underwent a CTA.  CTA was normal.  It was recommended that he continue statin and follow-up with cardiology as needed.  Hyperlipidemia-he was started on rosuvastatin back in November. Lab Results  Component Value Date   CHOL 290 (H) 07/30/2017   HDL 39 (L) 07/30/2017   LDLCALC 194 (H) 07/30/2017   TRIG 337 (H) 07/30/2017   CHOLHDL 7.4 (H) 07/30/2017   He reports that he had   Diverticulitis was treated.  Had colo.  Has menier's disease.  Has occasional vertigo symptoms. Reports that he has had some light headedess and ear popping.  Reports that he had bilateral temporal pain right before Christmas. "just for a second."  Occurs periodically.  Feels like "my eyes have to catch up with my head." Has had some anxiety.  Sleeping well.  HA occurs 2-3 times a day, randomly.  Has recently started seeing a chiropractor.     Review of Systems    see HPI  Past Medical History:  Diagnosis Date  . Allergy   . Diverticulitis   . GERD (gastroesophageal reflux disease)   . History of chicken pox   . IBS (irritable bowel syndrome)   . Meniere's disease      Social History   Socioeconomic History  . Marital status: Married    Spouse name: Not on file  . Number of children: 3  . Years of education: Not on file  . Highest education level: Not on file  Occupational History  . Not on file  Social Needs  . Financial resource strain: Not on file  . Food insecurity:    Worry: Not on file    Inability: Not on file  . Transportation needs:    Medical:  Not on file    Non-medical: Not on file  Tobacco Use  . Smoking status: Never Smoker  . Smokeless tobacco: Never Used  Substance and Sexual Activity  . Alcohol use: No  . Drug use: No  . Sexual activity: Yes    Partners: Female  Lifestyle  . Physical activity:    Days per week: Not on file    Minutes per session: Not on file  . Stress: Not on file  Relationships  . Social connections:    Talks on phone: Not on file    Gets together: Not on file    Attends religious service: Not on file    Active member of club or organization: Not on file    Attends meetings of clubs or organizations: Not on file    Relationship status: Not on file  . Intimate partner violence:    Fear of current or ex partner: Not on file    Emotionally abused: Not on file    Physically abused: Not on file    Forced sexual activity: Not on file  Other Topics Concern  . 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 TONSILLECTOMY AND ADENOIDECTOMY  1988  . VASECTOMY N/A 09/26/2014   Procedure: VASECTOMY;  Surgeon: Hollice Espy, MD;  Location: [facility] OR[facility];  Service: Urology;  Laterality: N/A;  . WISDOM TOOTH EXTRACTION      Family History  Problem Relation Age of Onset  . Hypertension Mother   . Diverticulitis Mother   . Colitis Mother   . Irritable bowel syndrome Mother   . Stroke Mother   . Hypertension Father   . Heart attack Father   . Cancer Maternal Grandfather   . Diverticulitis Maternal Uncle   . Irritable bowel syndrome Maternal Aunt   . Prostate cancer Neg Hx   . Kidney cancer Neg Hx   . Bladder Cancer Neg Hx   . Hearing loss Neg Hx   . Colon cancer Neg Hx   . Esophageal cancer Neg Hx   . Rectal cancer Neg Hx   . Stomach cancer Neg Hx     No Known Allergies  Current Outpatient Medications on File Prior to  Visit  Medication Sig Dispense Refill  . albuterol (PROVENTIL HFA;VENTOLIN HFA) 108 (90 Base) MCG/ACT inhaler Inhale 1-2 puffs into the lungs every 6 (six) hours as needed for wheezing or shortness of breath. 1 Inhaler 2  . calcium carbonate (TUMS - DOSED IN MG ELEMENTAL CALCIUM) 500 MG chewable tablet Chew 1 tablet by mouth as needed for indigestion or heartburn.    . fexofenadine (ALLEGRA) 60 MG tablet Take 60 mg by mouth daily.    . fluticasone (FLONASE) 50 MCG/ACT nasal spray Place 2 sprays into both nostrils daily. (Patient taking differently: Place 2 sprays into both nostrils as needed. ) 16 g 6  . montelukast (SINGULAIR) 10 MG tablet Take 1 tablet (10 mg total) by mouth at bedtime. 30 tablet 3  . omeprazole (PRILOSEC) 40 MG capsule Take 1 capsule (40 mg total) by mouth daily. 30 capsule 3  . rosuvastatin (CRESTOR) 10 MG tablet Take 1 tablet (10 mg total) by mouth daily. 30 tablet 3   No current facility-administered medications on file prior to visit.     BP 108/70 (BP Location: Right Arm, Patient Position: Sitting, Cuff Size: Large)   Pulse 70   Temp 98.5 F (36.9 C) (Oral)   Resp 16   Ht 5\' 8"  (1.727 m)   Wt 236 lb (107 kg)   SpO2 100%   BMI 35.88 kg/m    Objective:   Physical Exam Constitutional:      General: He is not in acute distress.    Appearance: He is well-developed.  HENT:     Head: Normocephalic and atraumatic.     Right Ear: Tympanic membrane and ear canal normal.     Left Ear: Ear canal normal.     Ears:     Comments: Left serous effusion noted.  Cardiovascular:     Rate and Rhythm: Normal rate and regular rhythm.     Heart sounds: No murmur.  Pulmonary:     Effort: Pulmonary effort is normal. No respiratory distress.     Breath sounds: Normal breath sounds. No wheezing or rales.  Skin:    General: Skin is warm and dry.  Neurological:     General: No focal deficit present.     Mental Status: He is alert and oriented to person, place, and time.      Sensory: Sensation  is intact.  Psychiatric:        Behavior: Behavior normal.        Thought Content: Thought content normal.           Assessment & Plan:  HA- mild, intermittent. Normal neuro exam. Will monitor. If symptoms worsen or if symptoms fail to improve would consider CT head. He has had some anxiety related to his health which we will continue to monitor.   Atypical CP- cardiac work up negative and reassuring. Will continue to monitor.  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 Likely cause for his mild dizziness. Will refill singulair. Start claritin D and flonase.  Hyperlipidemia- obtain follow up lipid panel.

## 2018-03-14 NOTE — Patient Instructions (Signed)
Please begin claritin D twice daily for the next few weeks, then you can change to regular claritin. Restart flonase and singulair. Call if new/worsening symptoms or if symptoms fail to improve. Complete lab work prior to leaving.

## 2018-03-15 ENCOUNTER — Telehealth: Payer: Self-pay | Admitting: Family

## 2018-03-15 DIAGNOSIS — M40292 Other kyphosis, cervical region: Secondary | ICD-10-CM | POA: Diagnosis not present

## 2018-03-15 DIAGNOSIS — M9901 Segmental and somatic dysfunction of cervical region: Secondary | ICD-10-CM | POA: Diagnosis not present

## 2018-03-15 DIAGNOSIS — M62838 Other muscle spasm: Secondary | ICD-10-CM | POA: Diagnosis not present

## 2018-03-15 DIAGNOSIS — M542 Cervicalgia: Secondary | ICD-10-CM | POA: Diagnosis not present

## 2018-03-15 MED ORDER — ROSUVASTATIN CALCIUM 20 MG PO TABS: 20.0000 mg | ORAL_TABLET | Freq: Every day | ORAL | 3 refills | Status: DC

## 2018-03-15 NOTE — Telephone Encounter (Signed)
Please let pt know that cholesterol is much better but still slightly above goal. Please increase crestor from 10mg  to 20mg   Repeat lipid panel in 6 weeks.

## 2018-03-16 ENCOUNTER — Other Ambulatory Visit: Payer: Self-pay

## 2018-03-16 DIAGNOSIS — E785 Hyperlipidemia, unspecified: Secondary | ICD-10-CM

## 2018-03-16 NOTE — Progress Notes (Signed)
lip

## 2018-03-16 NOTE — Telephone Encounter (Signed)
Results given to patient, advised of medication dose increase and to call back and schedule lab appointment to recheck lipid in 6 weeks. Order entered as future.

## 2018-03-21 DIAGNOSIS — M62838 Other muscle spasm: Secondary | ICD-10-CM | POA: Diagnosis not present

## 2018-03-21 DIAGNOSIS — M40292 Other kyphosis, cervical region: Secondary | ICD-10-CM | POA: Diagnosis not present

## 2018-03-21 DIAGNOSIS — M9901 Segmental and somatic dysfunction of cervical region: Secondary | ICD-10-CM | POA: Diagnosis not present

## 2018-03-21 DIAGNOSIS — M542 Cervicalgia: Secondary | ICD-10-CM | POA: Diagnosis not present

## 2018-03-22 DIAGNOSIS — M62838 Other muscle spasm: Secondary | ICD-10-CM | POA: Diagnosis not present

## 2018-03-22 DIAGNOSIS — M9901 Segmental and somatic dysfunction of cervical region: Secondary | ICD-10-CM | POA: Diagnosis not present

## 2018-03-22 DIAGNOSIS — M542 Cervicalgia: Secondary | ICD-10-CM | POA: Diagnosis not present

## 2018-03-22 DIAGNOSIS — M40292 Other kyphosis, cervical region: Secondary | ICD-10-CM | POA: Diagnosis not present

## 2018-03-23 MED FILL — ROSUVASTATIN CALCIUM 20 MG: 20 | 30 days supply | Qty: 30 | Fill #0

## 2018-03-23 MED FILL — MONTELUKAST SOD 10 MG TAB: 10 | 30 days supply | Qty: 30 | Fill #0

## 2018-03-24 DIAGNOSIS — M62838 Other muscle spasm: Secondary | ICD-10-CM | POA: Diagnosis not present

## 2018-03-24 DIAGNOSIS — M40292 Other kyphosis, cervical region: Secondary | ICD-10-CM | POA: Diagnosis not present

## 2018-03-24 DIAGNOSIS — M542 Cervicalgia: Secondary | ICD-10-CM | POA: Diagnosis not present

## 2018-03-24 DIAGNOSIS — M9901 Segmental and somatic dysfunction of cervical region: Secondary | ICD-10-CM | POA: Diagnosis not present

## 2018-03-25 ENCOUNTER — Ambulatory Visit: Payer: 59 | Admitting: Family

## 2018-03-25 VITALS — BP 120/76 | HR 78 | Temp 97.6°F | Resp 16 | Ht 68.0 in | Wt 226.0 lb

## 2018-03-25 DIAGNOSIS — F329 Major depressive disorder, single episode, unspecified: Secondary | ICD-10-CM

## 2018-03-25 DIAGNOSIS — F419 Anxiety disorder, unspecified: Secondary | ICD-10-CM | POA: Diagnosis not present

## 2018-03-25 DIAGNOSIS — F429 Obsessive-compulsive disorder, unspecified: Secondary | ICD-10-CM | POA: Diagnosis not present

## 2018-03-25 DIAGNOSIS — F32A Depression, unspecified: Secondary | ICD-10-CM

## 2018-03-25 MED ORDER — ESCITALOPRAM OXALATE 10 MG PO TABS: ORAL_TABLET | ORAL | 0 refills | Status: DC

## 2018-03-25 MED FILL — ESCITALOPRAM 10 MG TABLET: 10 | 30 days supply | Qty: 30 | Fill #0

## 2018-03-25 NOTE — Progress Notes (Signed)
Subjective:    Patient ID: Chad Ward, male    DOB: 10-24-1975, 43 y.o.   MRN: [id number]  HPI  Patient is a 43 yr old male who presents today to discuss anxiety.  Reports that he has "always had OCD."  Has managed through it on his own. Reports that he has had a lot of stress at work and at home and the work up of his health concerns this past fall caused him increased anxiety. Reports that he was out of town at the movies this past weekend and had an anxiety attack while in the movies. Notes that his mood has been down.  Reports that he does not do a lot of repetitive behaviors but does have intrusive thoughts about scary things.  "The more that I try not to think about it the worse it gets." Denies any serious thoughts of hurting himself or others.      Review of Systems See HPI  Past Medical History:  Diagnosis Date  . Allergy   . Diverticulitis   . GERD (gastroesophageal reflux disease)   . History of chicken pox   . IBS (irritable bowel syndrome)   . Meniere's disease      Social History   Socioeconomic History  . Marital status: Married    Spouse name: Not on file  . Number of children: 3  . Years of education: Not on file  . Highest education level: Not on file  Occupational History  . Not on file  Social Needs  . Financial resource strain: Not on file  . Food insecurity:    Worry: Not on file    Inability: Not on file  . Transportation needs:    Medical: Not on file    Non-medical: Not on file  Tobacco Use  . Smoking status: Never Smoker  . Smokeless tobacco: Never Used  Substance and Sexual Activity  . Alcohol use: No  . Drug use: No  . Sexual activity: Yes    Partners: Female  Lifestyle  . Physical activity:    Days per week: Not on file    Minutes per session: Not on file  . Stress: Not on file  Relationships  . Social connections:    Talks on phone: Not on file    Gets together: Not on file    Attends religious service: Not on file   Active member of club or organization: Not on file    Attends meetings of clubs or organizations: Not on file    Relationship status: Not on file  . Intimate partner violence:    Fear of current or ex partner: Not on file    Emotionally abused: Not on file    Physically abused: Not on file    Forced sexual activity: Not on file  Other Topics Concern  . 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 TONSILLECTOMY AND ADENOIDECTOMY  1988  . VASECTOMY N/A 09/26/2014   Procedure: VASECTOMY;  Surgeon: Hollice Espy, MD;  Location: [facility] OR[facility];  Service: Urology;  Laterality: N/A;  . WISDOM TOOTH EXTRACTION      Family History  Problem Relation Age of Onset  . Hypertension Mother   . Diverticulitis Mother   . Colitis Mother   . Irritable  bowel syndrome Mother   . Stroke Mother   . Hypertension Father   . Heart attack Father   . Cancer Maternal Grandfather   . Diverticulitis Maternal Uncle   . Irritable bowel syndrome Maternal Aunt   . Prostate cancer Neg Hx   . Kidney cancer Neg Hx   . Bladder Cancer Neg Hx   . Hearing loss Neg Hx   . Colon cancer Neg Hx   . Esophageal cancer Neg Hx   . Rectal cancer Neg Hx   . Stomach cancer Neg Hx     No Known Allergies  Current Outpatient Medications on File Prior to Visit  Medication Sig Dispense Refill  . albuterol (PROVENTIL HFA;VENTOLIN HFA) 108 (90 Base) MCG/ACT inhaler Inhale 1-2 puffs into the lungs every 6 (six) hours as needed for wheezing or shortness of breath. 1 Inhaler 2  . calcium carbonate (TUMS - DOSED IN MG ELEMENTAL CALCIUM) 500 MG chewable tablet Chew 1 tablet by mouth as needed for indigestion or heartburn.    . fluticasone (FLONASE) 50 MCG/ACT nasal spray Place 2 sprays into both nostrils daily. 16 g 6  . loratadine-pseudoephedrine (CLARITIN-D 12  HOUR) 5-120 MG tablet Take 1 tablet by mouth 2 (two) times daily.    . montelukast (SINGULAIR) 10 MG tablet TAKE 1 TABLET BY MOUTH AT BEDTIME. 30 tablet 2  . omeprazole (PRILOSEC) 40 MG capsule Take 1 capsule (40 mg total) by mouth daily. 30 capsule 3  . Probiotic Product (PROBIOTIC-10 PO) Take by mouth.    . rosuvastatin (CRESTOR) 20 MG tablet Take 1 tablet (20 mg total) by mouth daily. 30 tablet 3   No current facility-administered medications on file prior to visit.     BP 120/76 (BP Location: Right Arm, Patient Position: Sitting, Cuff Size: Large)   Pulse 78   Temp 97.6 F (36.4 C) (Oral)   Resp 16   Ht 5\' 8"  (1.727 m)   Wt 226 lb (102.5 kg)   SpO2 98%   BMI 34.36 kg/m       Objective:   Physical Exam Constitutional:      Appearance: Normal appearance.  Neurological:     Mental Status: He is alert.  Psychiatric:        Behavior: Behavior normal.        Thought Content: Thought content normal.        Judgment: Judgment normal.     Comments: Moderately anxious appearing           Assessment & Plan:  Anxiety/Depression/OCD-  Will initiate lexapro 10mg .  I instructed pt to start 1/2 tablet once daily for 1 week and then increase to a full tablet once daily on week two as tolerated.  We discussed common side effects such as nausea, drowsiness and weight gain.  Also discussed rare but serious side effect of suicide ideation.  Recommended that he consider counseling as well. She is instructed to discontinue medication go directly to ED if this occurs.  Pt verbalizes understanding.  Plan follow up in 1 month to evaluate progress.   A total of 30 minutes were spent face-to-face with the patient during this encounter and over half of that time was spent on counseling and coordination of care. The patient was counseled on depression/anxiety and ocd treatment.

## 2018-03-25 NOTE — Patient Instructions (Signed)
Please begin lexapro 1/2 tab once daily for 1 week, then increase to a full tab once daily for 1 week.

## 2018-03-28 DIAGNOSIS — M9901 Segmental and somatic dysfunction of cervical region: Secondary | ICD-10-CM | POA: Diagnosis not present

## 2018-03-28 DIAGNOSIS — M62838 Other muscle spasm: Secondary | ICD-10-CM | POA: Diagnosis not present

## 2018-03-28 DIAGNOSIS — M542 Cervicalgia: Secondary | ICD-10-CM | POA: Diagnosis not present

## 2018-03-28 DIAGNOSIS — M40292 Other kyphosis, cervical region: Secondary | ICD-10-CM | POA: Diagnosis not present

## 2018-03-30 ENCOUNTER — Encounter: Payer: Self-pay | Admitting: Family

## 2018-03-30 DIAGNOSIS — M40292 Other kyphosis, cervical region: Secondary | ICD-10-CM | POA: Diagnosis not present

## 2018-03-30 DIAGNOSIS — M542 Cervicalgia: Secondary | ICD-10-CM | POA: Diagnosis not present

## 2018-03-30 DIAGNOSIS — M62838 Other muscle spasm: Secondary | ICD-10-CM | POA: Diagnosis not present

## 2018-03-30 DIAGNOSIS — M9901 Segmental and somatic dysfunction of cervical region: Secondary | ICD-10-CM | POA: Diagnosis not present

## 2018-03-30 MED ORDER — HYDROXYZINE HCL 25 MG PO TABS: 25.0000 mg | ORAL_TABLET | Freq: Three times a day (TID) | ORAL | 0 refills | Status: DC | PRN

## 2018-03-30 MED FILL — hydrOXYzine HCL 25 MG TABS: 25 | 10 days supply | Qty: 30 | Fill #0

## 2018-03-30 NOTE — Addendum Note (Signed)
Addended by: [name] on: 03/30/2018 08:45 PM   Modules accepted: Orders

## 2018-04-04 DIAGNOSIS — M40292 Other kyphosis, cervical region: Secondary | ICD-10-CM | POA: Diagnosis not present

## 2018-04-04 DIAGNOSIS — M62838 Other muscle spasm: Secondary | ICD-10-CM | POA: Diagnosis not present

## 2018-04-04 DIAGNOSIS — M542 Cervicalgia: Secondary | ICD-10-CM | POA: Diagnosis not present

## 2018-04-04 DIAGNOSIS — M9901 Segmental and somatic dysfunction of cervical region: Secondary | ICD-10-CM | POA: Diagnosis not present

## 2018-04-06 DIAGNOSIS — M40292 Other kyphosis, cervical region: Secondary | ICD-10-CM | POA: Diagnosis not present

## 2018-04-06 DIAGNOSIS — M62838 Other muscle spasm: Secondary | ICD-10-CM | POA: Diagnosis not present

## 2018-04-06 DIAGNOSIS — M9901 Segmental and somatic dysfunction of cervical region: Secondary | ICD-10-CM | POA: Diagnosis not present

## 2018-04-06 DIAGNOSIS — M542 Cervicalgia: Secondary | ICD-10-CM | POA: Diagnosis not present

## 2018-04-11 DIAGNOSIS — M9901 Segmental and somatic dysfunction of cervical region: Secondary | ICD-10-CM | POA: Diagnosis not present

## 2018-04-11 DIAGNOSIS — M62838 Other muscle spasm: Secondary | ICD-10-CM | POA: Diagnosis not present

## 2018-04-11 DIAGNOSIS — M542 Cervicalgia: Secondary | ICD-10-CM | POA: Diagnosis not present

## 2018-04-11 DIAGNOSIS — M40292 Other kyphosis, cervical region: Secondary | ICD-10-CM | POA: Diagnosis not present

## 2018-04-14 DIAGNOSIS — M9901 Segmental and somatic dysfunction of cervical region: Secondary | ICD-10-CM | POA: Diagnosis not present

## 2018-04-14 DIAGNOSIS — M62838 Other muscle spasm: Secondary | ICD-10-CM | POA: Diagnosis not present

## 2018-04-14 DIAGNOSIS — M40292 Other kyphosis, cervical region: Secondary | ICD-10-CM | POA: Diagnosis not present

## 2018-04-14 DIAGNOSIS — M542 Cervicalgia: Secondary | ICD-10-CM | POA: Diagnosis not present

## 2018-04-15 ENCOUNTER — Ambulatory Visit: Payer: 59 | Admitting: Family

## 2018-04-18 DIAGNOSIS — M9901 Segmental and somatic dysfunction of cervical region: Secondary | ICD-10-CM | POA: Diagnosis not present

## 2018-04-18 DIAGNOSIS — M62838 Other muscle spasm: Secondary | ICD-10-CM | POA: Diagnosis not present

## 2018-04-18 DIAGNOSIS — M542 Cervicalgia: Secondary | ICD-10-CM | POA: Diagnosis not present

## 2018-04-18 DIAGNOSIS — M40292 Other kyphosis, cervical region: Secondary | ICD-10-CM | POA: Diagnosis not present

## 2018-04-22 ENCOUNTER — Encounter: Payer: Self-pay | Admitting: Family

## 2018-04-22 ENCOUNTER — Ambulatory Visit: Payer: 59 | Admitting: Family

## 2018-04-22 VITALS — BP 105/71 | HR 75 | Temp 98.1°F | Resp 16 | Ht 68.0 in | Wt 227.0 lb

## 2018-04-22 DIAGNOSIS — F419 Anxiety disorder, unspecified: Secondary | ICD-10-CM

## 2018-04-22 DIAGNOSIS — F429 Obsessive-compulsive disorder, unspecified: Secondary | ICD-10-CM | POA: Diagnosis not present

## 2018-04-22 MED ORDER — HYDROXYZINE HCL 25 MG PO TABS: 25.0000 mg | ORAL_TABLET | Freq: Three times a day (TID) | ORAL | 2 refills | Status: DC | PRN

## 2018-04-22 MED ORDER — ESCITALOPRAM OXALATE 10 MG PO TABS: 10.0000 mg | ORAL_TABLET | Freq: Every day | ORAL | 0 refills | Status: DC

## 2018-04-22 MED FILL — hydrOXYzine HCL 25 MG TABS: 25 | 10 days supply | Qty: 30 | Fill #0 | Status: TO

## 2018-04-22 MED FILL — ESCITALOPRAM 10 MG TABLET: 10 | 90 days supply | Qty: 90 | Fill #0

## 2018-04-22 NOTE — Patient Instructions (Signed)
Please continue current dose of lexapro.  °

## 2018-04-22 NOTE — Progress Notes (Signed)
Subjective:    Patient ID: Chad Ward, male    DOB: May 16, 1975, 43 y.o.   MRN: [id number]  HPI  Patient is a 43 yr old male who presents today for follow up of anxiety.  Last visit we added Lexapro 10 mg daily.  He contacted [place] shortly after starting Lexapro with reports of some panic attacks.  A prescription was sent for as needed hydroxyzine.  He reports that this medication made him a little sleepy but really did help prevent panic attacks.  He has since run out of the hydroxyzine and has had no further breakthrough panic attacks.  He reports tolerating the Lexapro without difficulty except for some mild sleepiness in the morning which he does not find bothersome at this time.  He reports that his obsessive thoughts have improved.  Denies suicidal ideation. Wt Readings from Last 3 Encounters:  04/22/18 227 lb (103 kg)  03/25/18 226 lb (102.5 kg)  03/14/18 236 lb (107 kg)     Review of Systems See HPI  Past Medical History:  Diagnosis Date  . Allergy   . Diverticulitis   . GERD (gastroesophageal reflux disease)   . History of chicken pox   . IBS (irritable bowel syndrome)   . Meniere's disease      Social History   Socioeconomic History  . Marital status: Married    Spouse name: Not on file  . Number of children: 3  . Years of education: Not on file  . Highest education level: Not on file  Occupational History  . Not on file  Social Needs  . Financial resource strain: Not on file  . Food insecurity:    Worry: Not on file    Inability: Not on file  . Transportation needs:    Medical: Not on file    Non-medical: Not on file  Tobacco Use  . Smoking status: Never Smoker  . Smokeless tobacco: Never Used  Substance and Sexual Activity  . Alcohol use: No  . Drug use: No  . Sexual activity: Yes    Partners: Female  Lifestyle  . Physical activity:    Days per week: Not on file    Minutes per session: Not on file  . Stress: Not on file  Relationships  .  Social connections:    Talks on phone: Not on file    Gets together: Not on file    Attends religious service: Not on file    Active member of club or organization: Not on file    Attends meetings of clubs or organizations: Not on file    Relationship status: Not on file  . Intimate partner violence:    Fear of current or ex partner: Not on file    Emotionally abused: Not on file    Physically abused: Not on file    Forced sexual activity: Not on file  Other Topics Concern  . 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 TONSILLECTOMY AND ADENOIDECTOMY  1988  . VASECTOMY N/A 09/26/2014   Procedure: VASECTOMY;  Surgeon: Hollice Espy, MD;  Location: [facility] ORS;  Service: Urology;  Laterality: N/A;  . WISDOM TOOTH EXTRACTION      Family History  Problem Relation Age of Onset  . Hypertension Mother   .  Diverticulitis Mother   . Colitis Mother   . Irritable bowel syndrome Mother   . Stroke Mother   . Hypertension Father   . Heart attack Father   . Cancer Maternal Grandfather   . Diverticulitis Maternal Uncle   . Irritable bowel syndrome Maternal Aunt   . Prostate cancer Neg Hx   . Kidney cancer Neg Hx   . Bladder Cancer Neg Hx   . Hearing loss Neg Hx   . Colon cancer Neg Hx   . Esophageal cancer Neg Hx   . Rectal cancer Neg Hx   . Stomach cancer Neg Hx     No Known Allergies  Current Outpatient Medications on File Prior to Visit  Medication Sig Dispense Refill  . albuterol (PROVENTIL HFA;VENTOLIN HFA) 108 (90 Base) MCG/ACT inhaler Inhale 1-2 puffs into the lungs every 6 (six) hours as needed for wheezing or shortness of breath. 1 Inhaler 2  . calcium carbonate (TUMS - DOSED IN MG ELEMENTAL CALCIUM) 500 MG chewable tablet Chew 1 tablet by mouth as needed for indigestion or heartburn.    .  fluticasone (FLONASE) 50 MCG/ACT nasal spray Place 2 sprays into both nostrils daily. 16 g 6  . loratadine-pseudoephedrine (CLARITIN-D 12 HOUR) 5-120 MG tablet Take 1 tablet by mouth 2 (two) times daily.    . montelukast (SINGULAIR) 10 MG tablet TAKE 1 TABLET BY MOUTH AT BEDTIME. 30 tablet 2  . omeprazole (PRILOSEC) 40 MG capsule Take 1 capsule (40 mg total) by mouth daily. 30 capsule 3  . Probiotic Product (PROBIOTIC-10 PO) Take by mouth.    . rosuvastatin (CRESTOR) 20 MG tablet Take 1 tablet (20 mg total) by mouth daily. 30 tablet 3   No current facility-administered medications on file prior to visit.     BP 105/71 (BP Location: Right Arm, Patient Position: Sitting, Cuff Size: Large)   Pulse 75   Temp 98.1 F (36.7 C) (Oral)   Resp 16   Ht 5\' 8"  (1.727 m)   Wt 227 lb (103 kg)   SpO2 100%   BMI 34.52 kg/m       Objective:   Physical Exam Constitutional:      Appearance: Normal appearance.  Neurological:     General: No focal deficit present.     Mental Status: He is alert and oriented to person, place, and time.  Psychiatric:        Mood and Affect: Mood normal.        Behavior: Behavior normal.        Thought Content: Thought content normal.           Assessment & Plan:  Anxiety/OCD- significantly improved on Lexapro 10 mg.  Weight is stable.  We will continue Lexapro.  A refill was provided for as needed hydroxyzine to have on hand.  Plan to bring him back in 3 months for follow-up.  He is advised to reach out if he has any worsening anxiety or OCD symptoms in the meantime.   A total of 15  minutes were spent face-to-face with the patient during this encounter and over half of that time was spent on counseling and coordination of care. The patient was counseled on anxiety/ocd treatment.

## 2018-04-25 DIAGNOSIS — M542 Cervicalgia: Secondary | ICD-10-CM | POA: Diagnosis not present

## 2018-04-25 DIAGNOSIS — M40292 Other kyphosis, cervical region: Secondary | ICD-10-CM | POA: Diagnosis not present

## 2018-04-25 DIAGNOSIS — M62838 Other muscle spasm: Secondary | ICD-10-CM | POA: Diagnosis not present

## 2018-04-25 DIAGNOSIS — M9901 Segmental and somatic dysfunction of cervical region: Secondary | ICD-10-CM | POA: Diagnosis not present

## 2018-05-02 MED FILL — MONTELUKAST SOD 10 MG TAB: 10 | 30 days supply | Qty: 30 | Fill #1

## 2018-05-02 MED FILL — ROSUVASTATIN CALCIUM 20 MG: 20 | 90 days supply | Qty: 90 | Fill #1

## 2018-05-03 IMAGING — DX DG CHEST 2V
2 series · 2 of 2 positions shown · non-contrast
Comparison: None.

CLINICAL DATA: Cough, congestion

EXAM:
CHEST - 2 VIEW

[chest pa]
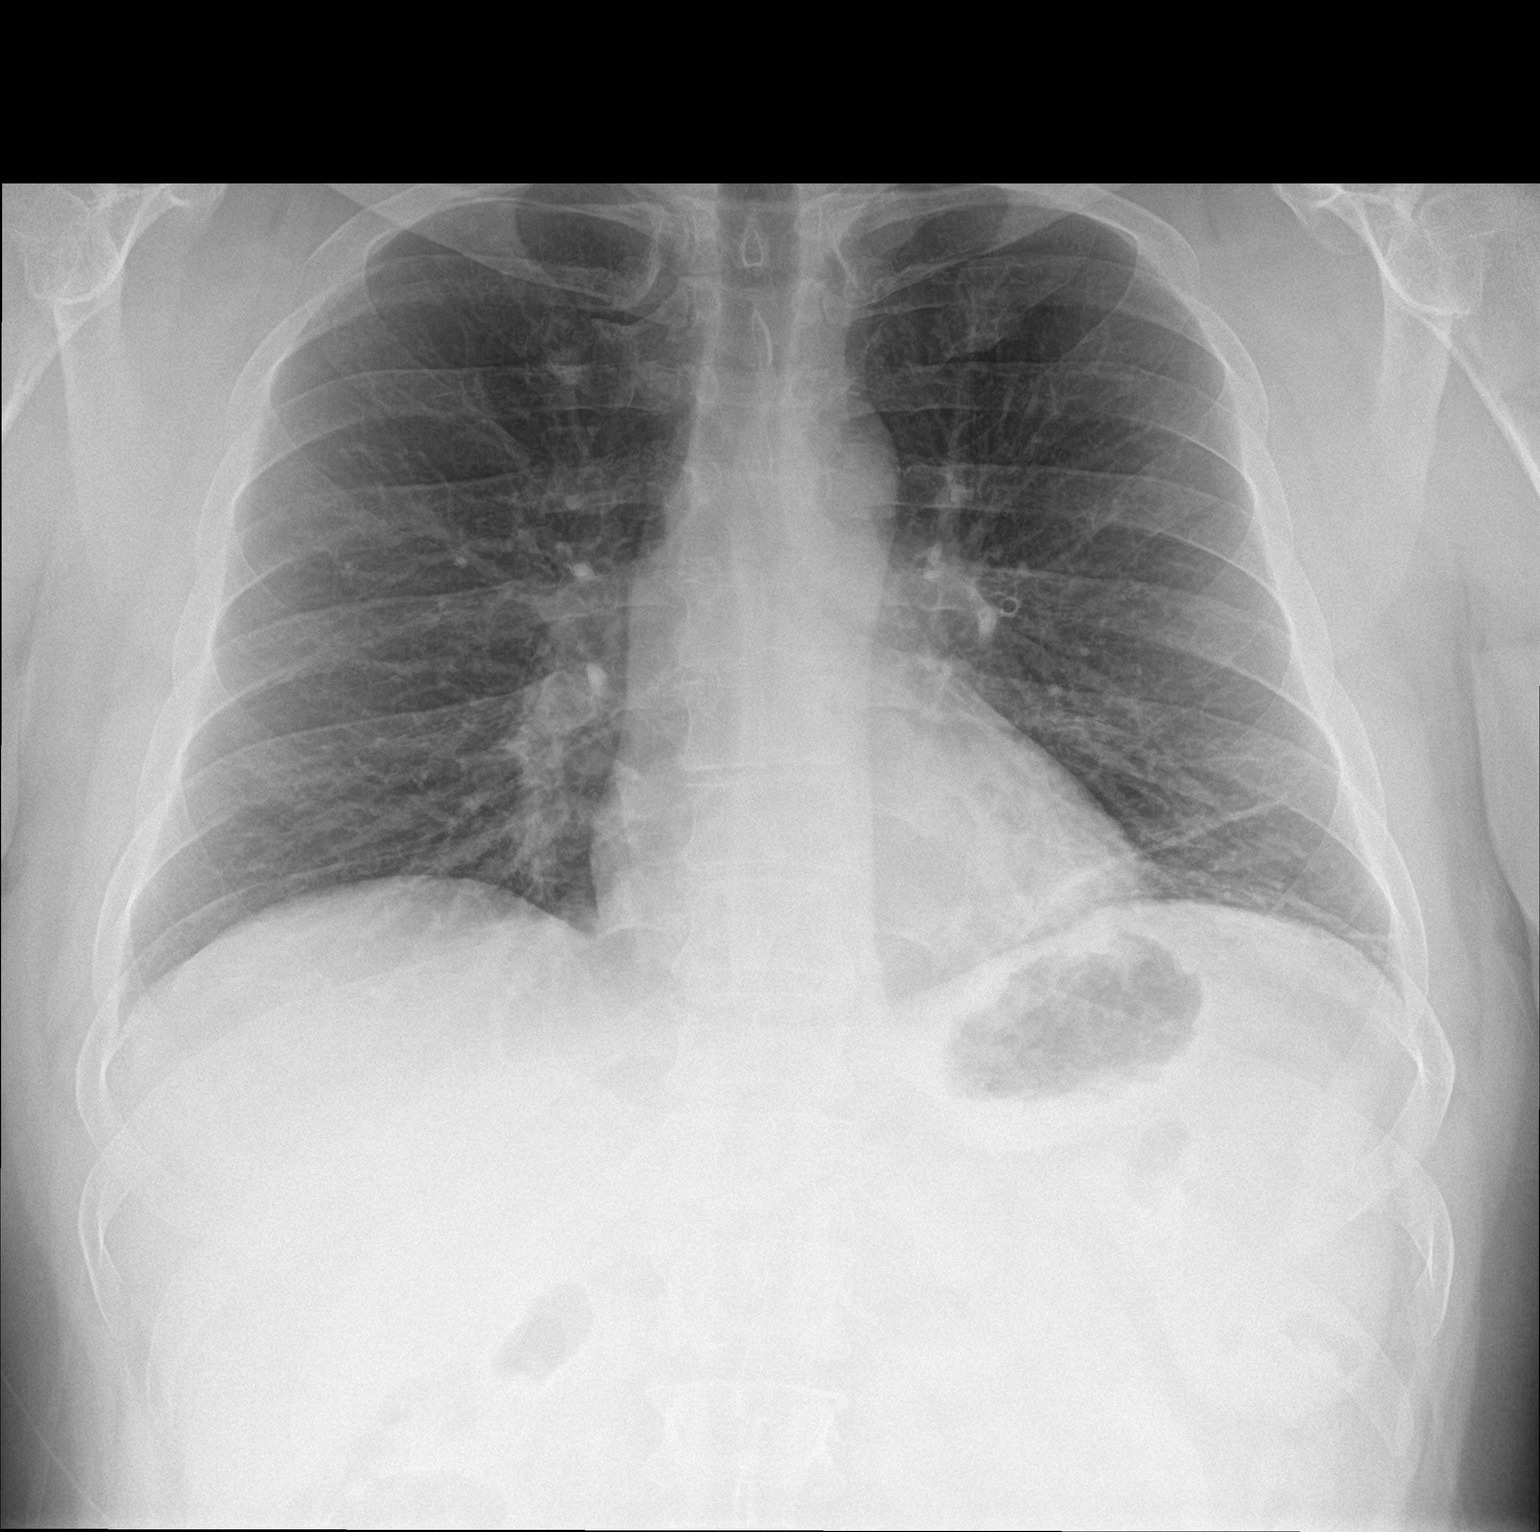

[chest lat]
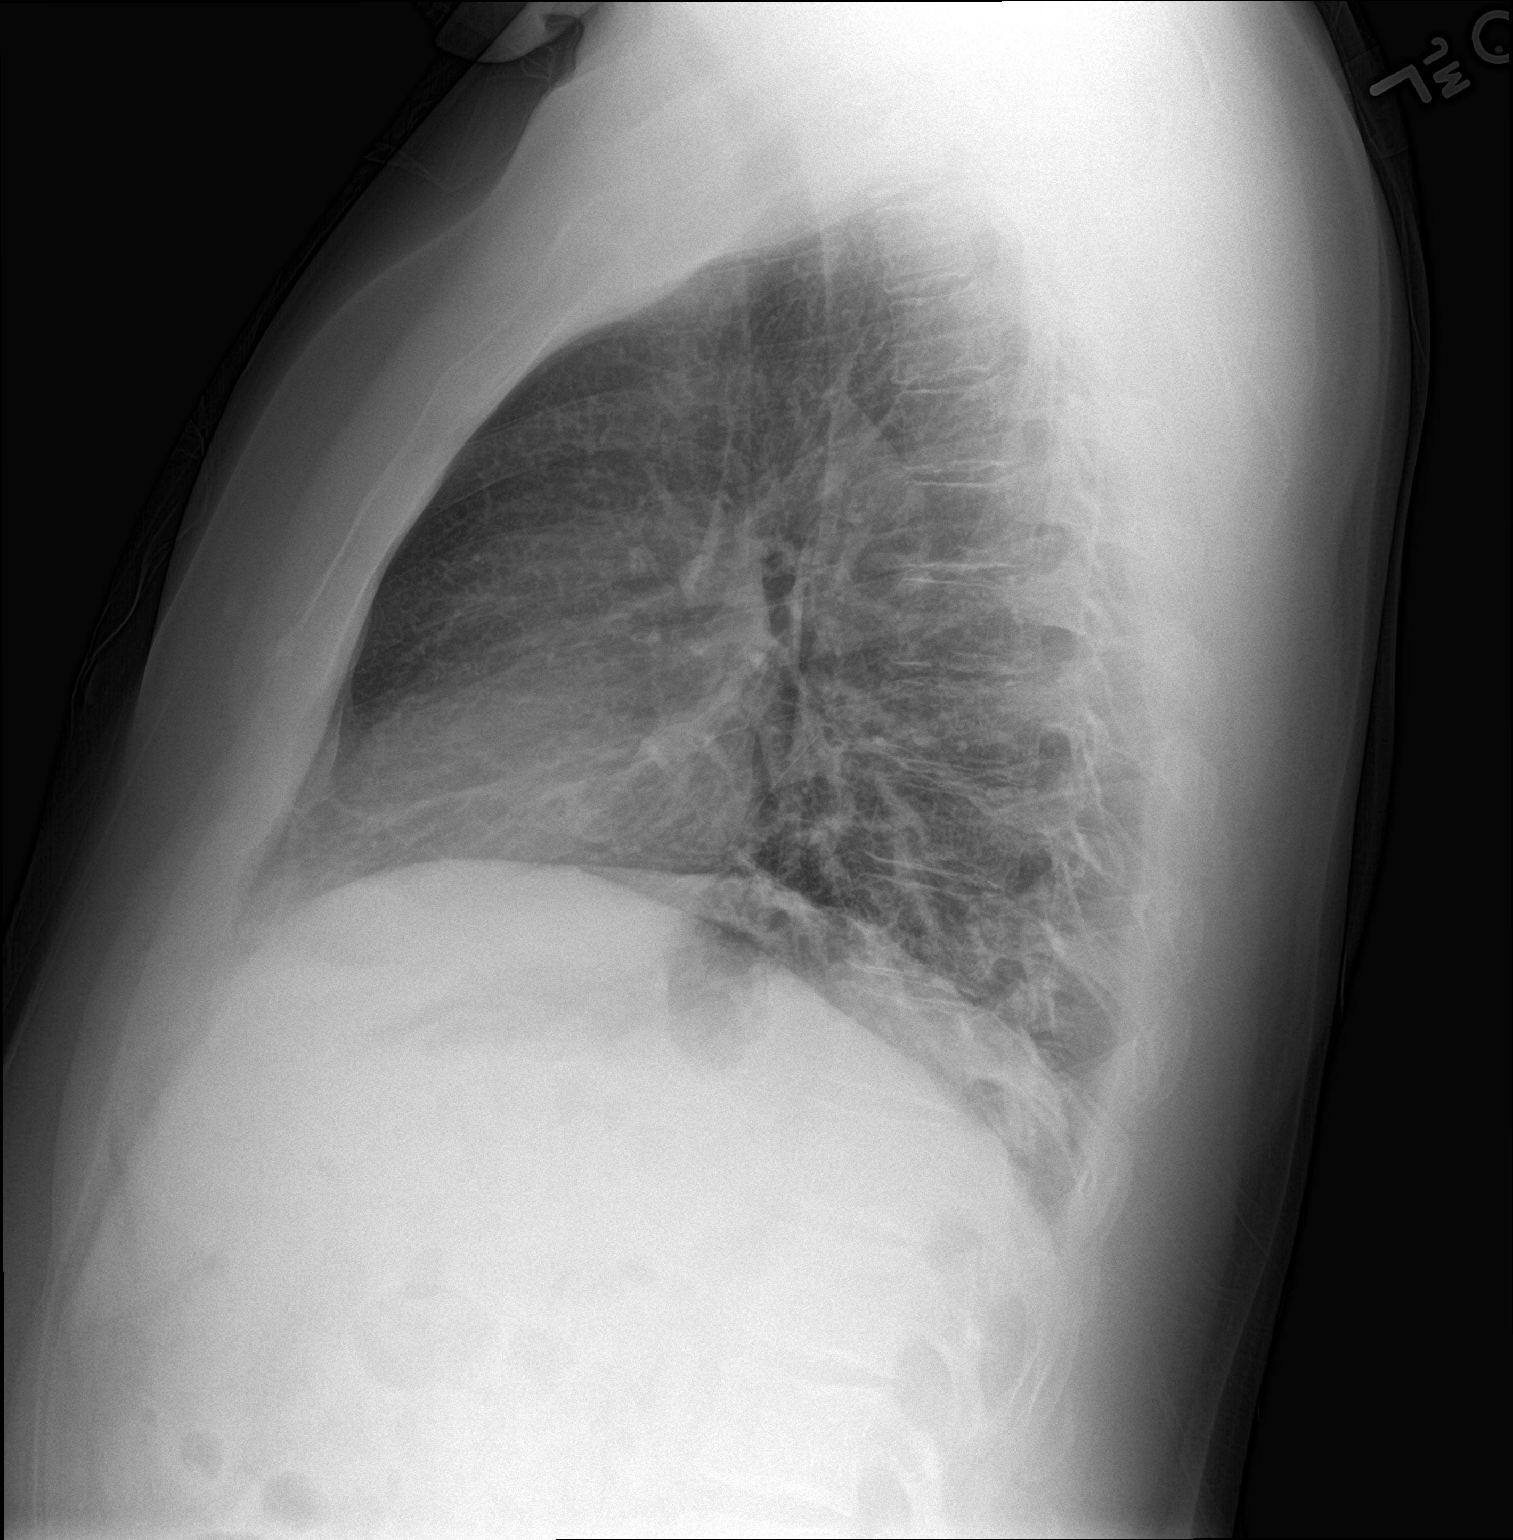

[2 of 2 positions shown; findings below may reference images not displayed]

FINDINGS: Linear atelectasis in the left base. Right lung is clear. Heart is
normal size. No effusions or acute bony abnormality.
IMPRESSION: Left base atelectasis.

## 2018-06-22 ENCOUNTER — Encounter: Payer: Self-pay | Admitting: Family

## 2018-07-12 ENCOUNTER — Other Ambulatory Visit: Payer: Self-pay | Admitting: Family

## 2018-07-12 MED FILL — hydrOXYzine HCL 25 MG TABS: 25 | 10 days supply | Qty: 30 | Fill #0

## 2018-07-12 MED FILL — MONTELUKAST SOD 10 MG TAB: 10 | 30 days supply | Qty: 30 | Fill #2

## 2018-07-12 MED FILL — ESCITALOPRAM 10 MG TABLET: 10 | 90 days supply | Qty: 90 | Fill #0

## 2018-07-12 MED FILL — OMEPRAZOLE 40 MG CPDR: 40 | 30 days supply | Qty: 30 | Fill #1

## 2018-07-22 ENCOUNTER — Other Ambulatory Visit: Payer: Self-pay

## 2018-07-22 ENCOUNTER — Telehealth (INDEPENDENT_AMBULATORY_CARE_PROVIDER_SITE_OTHER): Payer: 59 | Admitting: Family

## 2018-07-22 ENCOUNTER — Encounter: Payer: Self-pay | Admitting: Family

## 2018-07-22 VITALS — HR 80 | Temp 98.2°F | Ht 68.0 in | Wt 240.0 lb

## 2018-07-22 DIAGNOSIS — K219 Gastro-esophageal reflux disease without esophagitis: Secondary | ICD-10-CM | POA: Diagnosis not present

## 2018-07-22 DIAGNOSIS — F429 Obsessive-compulsive disorder, unspecified: Secondary | ICD-10-CM

## 2018-07-22 DIAGNOSIS — E785 Hyperlipidemia, unspecified: Secondary | ICD-10-CM | POA: Diagnosis not present

## 2018-07-22 DIAGNOSIS — F419 Anxiety disorder, unspecified: Secondary | ICD-10-CM

## 2018-07-22 MED ORDER — OMEPRAZOLE 40 MG PO CPDR: 40.0000 mg | DELAYED_RELEASE_CAPSULE | Freq: Every day | ORAL | 3 refills | Status: DC | PRN

## 2018-07-22 NOTE — Progress Notes (Signed)
Virtual Visit via Video Note  I connected with Lillard Anes on 07/22/18 at  1:00 PM EDT by a video enabled telemedicine application and verified that I am speaking with the correct person using two identifiers. This visit type was conducted due to national recommendations for restrictions regarding the COVID-19 Pandemic (e.g. social distancing).  This format is felt to be most appropriate for this patient at this time.   I discussed the limitations of evaluation and management by telemedicine and the availability of in person appointments. The patient expressed understanding and agreed to proceed.  Only the patient and myself were on today's video visit. The patient was at home and I was in my office at the time of today's visit.   History of Present Illness:  Pt is a 43 yr old male who presents today for follow up of his anxiety.   Reports OCD thoughts come and go.  He feels like he is able to push these obsessive thoughts out of the way without much difficulty at this time.  He reports overall his anxiety is well controlled.  He admits to eating poorly and not getting enough exercise.  He attributes these habits to his recent weight gain. Wt Readings from Last 3 Encounters:  07/22/18 240 lb (108.9 kg)  04/22/18 227 lb (103 kg)  03/25/18 226 lb (102.5 kg)   Seasonal allergies-reports that overall symptoms have been stable.     GERD- reports no recent gerd symptoms. He only uses prilosec. PRN.   Hyperlipidemia- maintained on statin. Notes some joint stiffness but attributes this to not exercising.  Lab Results  Component Value Date   CHOL 208 (H) 03/14/2018   HDL 40.70 03/14/2018   LDLCALC 134 (H) 03/14/2018   TRIG 169.0 (H) 03/14/2018   CHOLHDL 5 03/14/2018    Observations/Objective:  Gen: Awake, alert, no acute distress Resp: Breathing is even and non-labored Psych: calm/pleasant demeanor Neuro: Alert and Oriented x 3, + facial symmetry, speech is clear.    Assessment and  Plan:  Anxiety/OCD- stable on current dose of lexapro, continue same. We did discuss his weight gain. He plans to work on diet and exercise and monitor his weight. I asked him to contact me if he has any further weight gain.   GERD- stable with only prn ppi. Monitor.  Seasonal allergies- stable on current meds. Continue same.   Hyperlipidemia- plan to check lipids next time he is in the offic.e  Follow Up Instructions:    I discussed the assessment and treatment plan with the patient. The patient was provided an opportunity to ask questions and all were answered. The patient agreed with the plan and demonstrated an understanding of the instructions.   The patient was advised to call back or seek an in-person evaluation if the symptoms worsen or if the condition fails to improve as anticipated.    Nance Pear, NP

## 2018-10-20 DIAGNOSIS — Z20828 Contact with and (suspected) exposure to other viral communicable diseases: Secondary | ICD-10-CM | POA: Diagnosis not present

## 2018-10-21 ENCOUNTER — Other Ambulatory Visit: Payer: Self-pay

## 2018-10-21 ENCOUNTER — Telehealth (INDEPENDENT_AMBULATORY_CARE_PROVIDER_SITE_OTHER): Payer: 59 | Admitting: Family

## 2018-10-21 DIAGNOSIS — F419 Anxiety disorder, unspecified: Secondary | ICD-10-CM | POA: Diagnosis not present

## 2018-10-21 DIAGNOSIS — J45909 Unspecified asthma, uncomplicated: Secondary | ICD-10-CM | POA: Diagnosis not present

## 2018-10-21 DIAGNOSIS — K219 Gastro-esophageal reflux disease without esophagitis: Secondary | ICD-10-CM | POA: Diagnosis not present

## 2018-10-21 DIAGNOSIS — J309 Allergic rhinitis, unspecified: Secondary | ICD-10-CM

## 2018-10-21 DIAGNOSIS — E785 Hyperlipidemia, unspecified: Secondary | ICD-10-CM | POA: Diagnosis not present

## 2018-10-21 MED ORDER — ROSUVASTATIN CALCIUM 20 MG PO TABS: 20.0000 mg | ORAL_TABLET | Freq: Every day | ORAL | 1 refills | Status: DC

## 2018-10-21 MED ORDER — ESCITALOPRAM OXALATE 10 MG PO TABS: 10.0000 mg | ORAL_TABLET | Freq: Every day | ORAL | 1 refills | Status: DC

## 2018-10-21 MED ORDER — ESCITALOPRAM OXALATE 10 MG PO TABS: 10.0000 mg | ORAL_TABLET | Freq: Every day | ORAL | 0 refills | Status: DC

## 2018-10-21 MED FILL — ESCITALOPRAM 10 MG TABLET: 10 | 90 days supply | Qty: 90 | Fill #0

## 2018-10-21 MED FILL — ROSUVASTATIN CALCIUM 20 MG: 20 | 90 days supply | Qty: 90 | Fill #0

## 2018-10-21 NOTE — Progress Notes (Signed)
Subjective:    Patient ID: Chad Ward, male    DOB: 1975/03/31, 43 y.o.   MRN: [id number]  HPI   Patient is a 43 year old male who presents today for routine follow-up.  Anxiety-he is maintained on Lexapro 10 mg once daily.  He reports overall his anxiety has been well controlled.  Reports that he had 2-3 days of anxiety last week.  He reports feeling fine currently.  Overall anxiety has been well controlled.  gerd- uses omeprazole prn.  He is not needing on a daily basis  Asthma- reports that he has not been using singulair.Marland Kitchen  He does have albuterol on hand as needed.  He has not needed it recently.  Allergic rhinitis-reports symptoms are stable with as needed use of Flonase.  Review of Systems    see HPI  Past Medical History:  Diagnosis Date  . Allergy   . Diverticulitis   . GERD (gastroesophageal reflux disease)   . History of chicken pox   . IBS (irritable bowel syndrome)   . Meniere's disease      Social History   Socioeconomic History  . Marital status: Married    Spouse name: Not on file  . Number of children: 3  . Years of education: Not on file  . Highest education level: Not on file  Occupational History  . Not on file  Social Needs  . Financial resource strain: Not on file  . Food insecurity    Worry: Not on file    Inability: Not on file  . Transportation needs    Medical: Not on file    Non-medical: Not on file  Tobacco Use  . Smoking status: Never Smoker  . Smokeless tobacco: Never Used  Substance and Sexual Activity  . Alcohol use: No  . Drug use: No  . Sexual activity: Yes    Partners: Female  Lifestyle  . Physical activity    Days per week: Not on file    Minutes per session: Not on file  . Stress: Not on file  Relationships  . Social Herbalist on phone: Not on file    Gets together: Not on file    Attends religious service: Not on file    Active member of club or organization: Not on file    Attends meetings of  clubs or organizations: Not on file    Relationship status: Not on file  . Intimate partner violence    Fear of current or ex partner: Not on file    Emotionally abused: Not on file    Physically abused: Not on file    Forced sexual activity: Not on file  Other Topics Concern  . 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 TONSILLECTOMY AND ADENOIDECTOMY  1988  . VASECTOMY N/A 09/26/2014   Procedure: VASECTOMY;  Surgeon: Hollice Espy, MD;  Location: [facility] ORS;  Service: Urology;  Laterality: N/A;  . WISDOM TOOTH EXTRACTION      Family History  Problem Relation Age of Onset  . Hypertension Mother   . Diverticulitis Mother   . Colitis Mother   . Irritable bowel syndrome Mother   . Stroke Mother   . Hypertension Father   . Heart attack Father   .  Cancer Maternal Grandfather   . Diverticulitis Maternal Uncle   . Irritable bowel syndrome Maternal Aunt   . Prostate cancer Neg Hx   . Kidney cancer Neg Hx   . Bladder Cancer Neg Hx   . Hearing loss Neg Hx   . Colon cancer Neg Hx   . Esophageal cancer Neg Hx   . Rectal cancer Neg Hx   . Stomach cancer Neg Hx     No Known Allergies  Current Outpatient Medications on File Prior to Visit  Medication Sig Dispense Refill  . albuterol (PROVENTIL HFA;VENTOLIN HFA) 108 (90 Base) MCG/ACT inhaler Inhale 1-2 puffs into the lungs every 6 (six) hours as needed for wheezing or shortness of breath. 1 Inhaler 2  . calcium carbonate (TUMS - DOSED IN MG ELEMENTAL CALCIUM) 500 MG chewable tablet Chew 1 tablet by mouth as needed for indigestion or heartburn.    . fluticasone (FLONASE) 50 MCG/ACT nasal spray Place 2 sprays into both nostrils daily. 16 g 6  . hydrOXYzine (ATARAX/VISTARIL) 25 MG tablet Take 1 tablet (25 mg total) by mouth 3 (three) times daily as needed for  anxiety. 30 tablet 2  . loratadine-pseudoephedrine (CLARITIN-D 12 HOUR) 5-120 MG tablet Take 1 tablet by mouth 2 (two) times daily.    Marland Kitchen omeprazole (PRILOSEC) 40 MG capsule Take 1 capsule (40 mg total) by mouth daily as needed. 30 capsule 3  . Probiotic Product (PROBIOTIC-10 PO) Take by mouth.    . rosuvastatin (CRESTOR) 20 MG tablet Take 1 tablet (20 mg total) by mouth daily. 30 tablet 3   No current facility-administered medications on file prior to visit.     There were no vitals taken for this visit.   Objective:   Physical Exam   Gen: Awake, alert, no acute distress Resp: Breathing is even and non-labored Psych: calm/pleasant demeanor Neuro: Alert and Oriented x 3, + facial symmetry, speech is clear.       Assessment & Plan:  Anxiety-stable on current dose of Lexapro.  Continue same.  Allergic rhinitis-stable with as needed Flonase.  Continue same.  GERD-stable with as needed use of omeprazole.  Continue same.  Asthma-currently stable.  Continue as needed albuterol  Hyperlipidemia-reports that he ran out of Crestor.  Will send refill so he can resume.  Plan a complete physical this fall and we will check his follow-up cholesterol at that time.

## 2018-12-16 IMAGING — CT CT HEART MORP W/ CTA COR W/ SCORE W/ CA W/CM &/OR W/O CM
4 of 7 series · 8 of 20 positions shown, 9 images · non-contrast
Comparison: None.

Addendum:
EXAM:
OVER-READ INTERPRETATION  CT CHEST

The following report is an over-read performed by radiologist Dr.
Jesus Alberto Denby [[email or web address]] on 02/04/2018. This
over-read does not include interpretation of cardiac or coronary
anatomy or pathology. The coronary CTA interpretation by the
cardiologist is attached.
CLINICAL DATA: Chest pain
Cardiac CTA
MEDICATIONS:
Sub lingual nitro. 4mg x 2
TECHNIQUE: The patient was scanned on a Siemens [REDACTED]ice scanner. Gantry
rotation speed was 250 msecs. Collimation was 0.6 mm. A 100 kV
prospective scan was triggered in the ascending thoracic aorta at
35-75% of the R-R interval. Average HR during the scan was 60 bpm.
The 3D data set was interpreted on a dedicated work station using
MPR, MIP and VRT modes. A total of 80cc of contrast was used.

[Series 7: best diast 75 % · axial · 0.39mm/px · z∈[+1282,+1320]mm · 2 of 286 slices shown, 3 images]
[im 96/286  vessel]
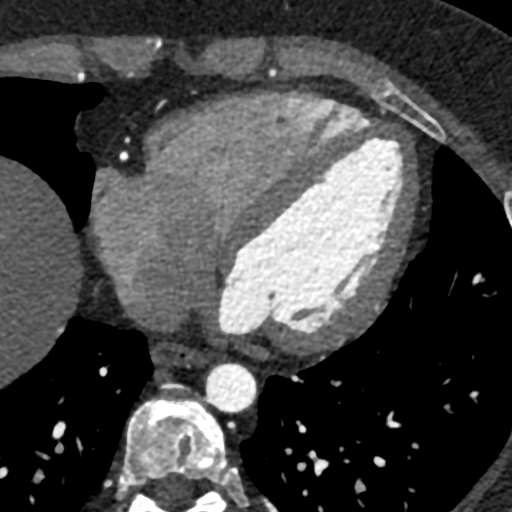
[im 96/286  lung]
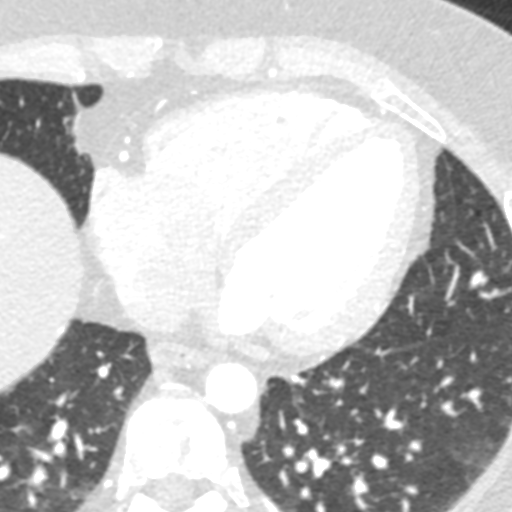
[im 191/286  vessel]
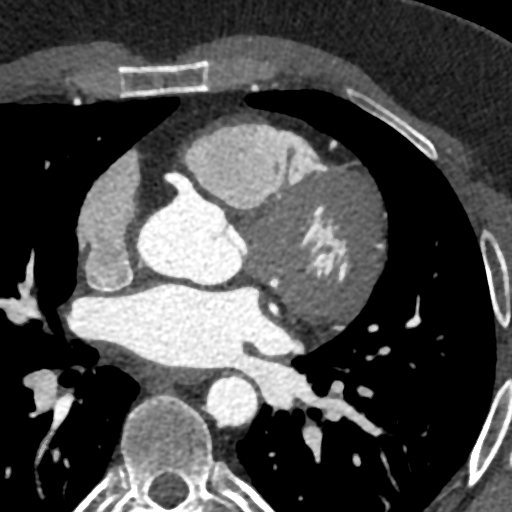

[Series 8: best syst 34 % · axial · 0.39mm/px · z∈[+1282,+1320]mm · 2 of 286 slices shown]
[im 96/286  vessel]
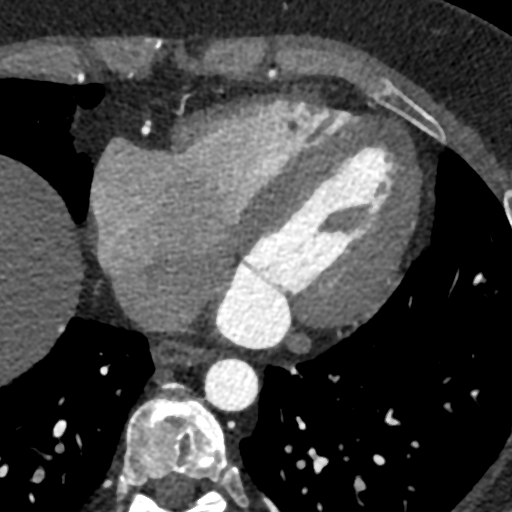
[im 191/286  vessel]
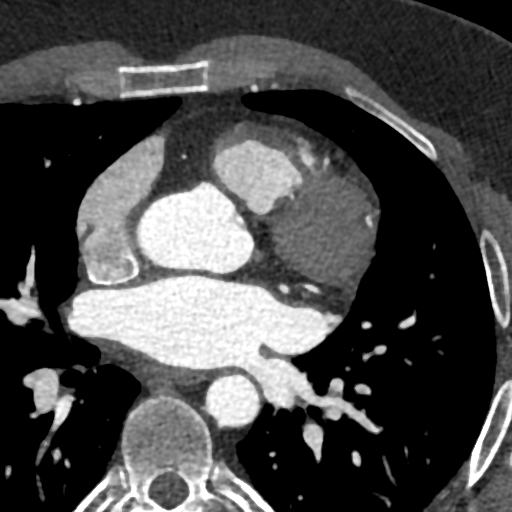

[Series 9: ts diast sharp 75 % · axial · 0.39mm/px · z∈[+1282,+1320]mm · 2 of 286 slices shown]
[im 96/286  lung]
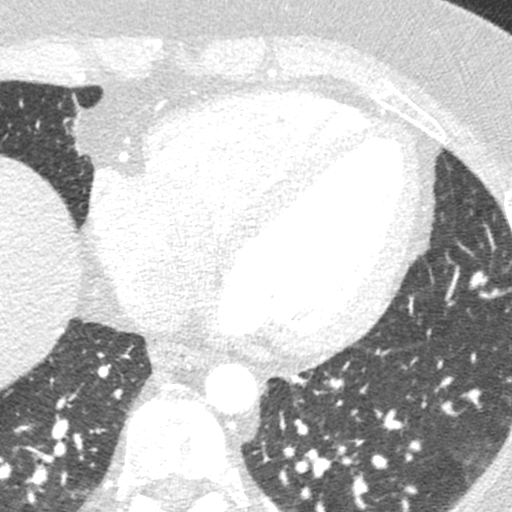
[im 191/286  lung]
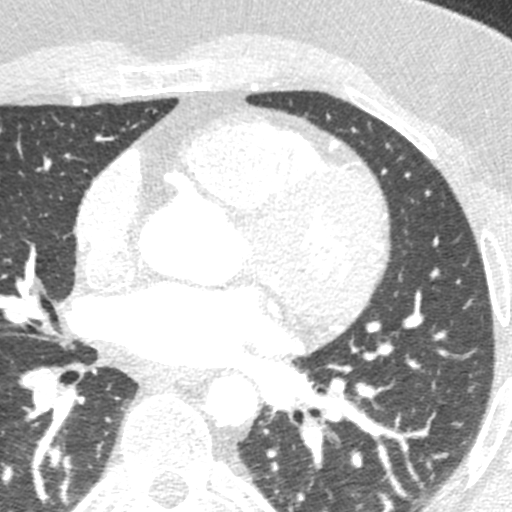

[Series 10: ts syst sharp 34 % · axial · 0.39mm/px · z∈[+1282,+1320]mm · 2 of 286 slices shown]
[im 96/286  lung]
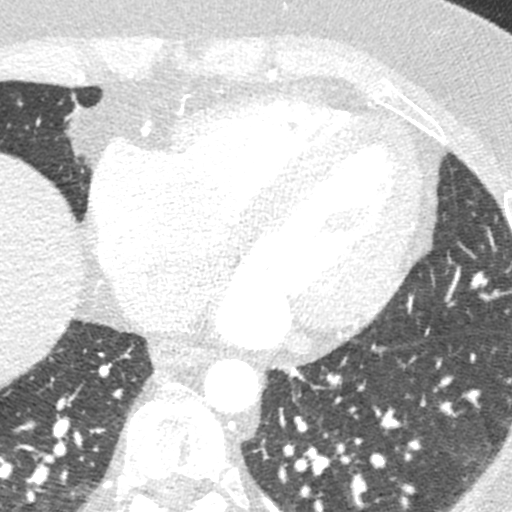
[im 191/286  lung]
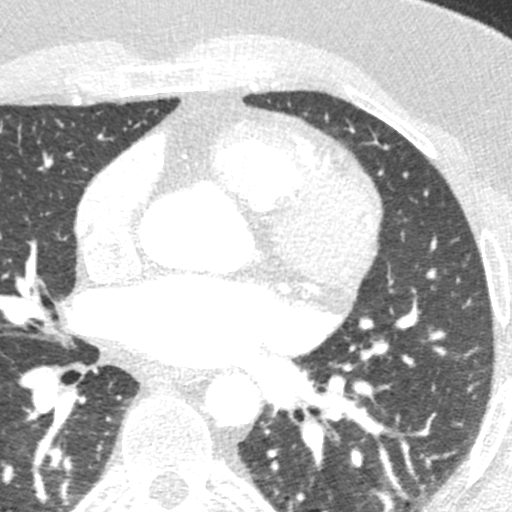

[8 of 20 positions shown; findings below may reference images not displayed]

FINDINGS: Vascular: Heart is normal size.  Visualized aorta normal caliber.

Mediastinum/Nodes: No adenopathy in the lower mediastinum or hila.

Lungs/Pleura: No confluent opacities or effusions.

Upper Abdomen: Imaging into the upper abdomen shows no acute
findings.

Musculoskeletal: Chest wall soft tissues are unremarkable. No acute
bony abnormality.
IMPRESSION: No acute or significant extracardiac abnormality.
FINDINGS: Non-cardiac: See separate report from [REDACTED].

Pulmonary veins drain normally to the left atrium.

Calcium Score:

Coronary Arteries: Right dominant with no anomalies

LM: No plaque or stenosis.

LAD system:  No plaque or stenosis.

Circumflex system: No plaque or stenosis.

RCA system:  No plaque or stenosis.
IMPRESSION: 1. Coronary artery calcium score 0 Agatston units, suggesting low
risk for future cardiac events.

2.  No significant coronary artery disease.

Dejmon Fokir

*** End of Addendum ***

## 2019-01-23 MED FILL — ESCITALOPRAM 10 MG TABLET: 10 | 90 days supply | Qty: 90 | Fill #1

## 2019-01-23 MED FILL — ROSUVASTATIN CALCIUM 20 MG: 20 | 90 days supply | Qty: 90 | Fill #1

## 2019-02-16 ENCOUNTER — Other Ambulatory Visit: Payer: Self-pay

## 2019-02-16 ENCOUNTER — Telehealth: Payer: Self-pay

## 2019-02-16 DIAGNOSIS — Z20822 Contact with and (suspected) exposure to covid-19: Secondary | ICD-10-CM

## 2019-02-16 NOTE — Telephone Encounter (Signed)
Patient called to let us know he was on his way to [facility] due to a covid exposure. Per health at work he was advised to go get tested today, wife works at [employer].  He was advised to call pcp to get test ordered. Order was entered for patient.

## 2019-02-20 DIAGNOSIS — R0981 Nasal congestion: Secondary | ICD-10-CM | POA: Diagnosis not present

## 2019-02-20 DIAGNOSIS — Z20828 Contact with and (suspected) exposure to other viral communicable diseases: Secondary | ICD-10-CM | POA: Diagnosis not present

## 2019-03-02 DIAGNOSIS — Z20828 Contact with and (suspected) exposure to other viral communicable diseases: Secondary | ICD-10-CM | POA: Diagnosis not present

## 2019-03-02 DIAGNOSIS — R0981 Nasal congestion: Secondary | ICD-10-CM | POA: Diagnosis not present

## 2019-04-24 ENCOUNTER — Encounter: Payer: Self-pay | Admitting: Family

## 2019-04-25 MED ORDER — ESCITALOPRAM OXALATE 10 MG PO TABS: 10.0000 mg | ORAL_TABLET | Freq: Every day | ORAL | 0 refills | Status: DC

## 2019-04-25 MED ORDER — ROSUVASTATIN CALCIUM 20 MG PO TABS: 20.0000 mg | ORAL_TABLET | Freq: Every day | ORAL | 0 refills | Status: DC

## 2019-04-25 MED FILL — ROSUVASTATIN CALCIUM 20 MG: 20 | 30 days supply | Qty: 30 | Fill #0

## 2019-04-25 MED FILL — ESCITALOPRAM 10 MG TABLET: 10 | 30 days supply | Qty: 30 | Fill #0

## 2019-05-22 ENCOUNTER — Encounter: Payer: Self-pay | Admitting: Family

## 2019-05-22 ENCOUNTER — Other Ambulatory Visit: Payer: Self-pay

## 2019-05-22 MED ORDER — ESCITALOPRAM OXALATE 10 MG PO TABS: 10.0000 mg | ORAL_TABLET | Freq: Every day | ORAL | 0 refills | Status: DC

## 2019-05-22 MED ORDER — ROSUVASTATIN CALCIUM 20 MG PO TABS: 20.0000 mg | ORAL_TABLET | Freq: Every day | ORAL | 0 refills | Status: DC

## 2019-05-22 MED FILL — ROSUVASTATIN CALCIUM 20 MG: 20 | 30 days supply | Qty: 30 | Fill #0

## 2019-05-22 MED FILL — ESCITALOPRAM 10 MG TABLET: 10 | 30 days supply | Qty: 30 | Fill #0

## 2019-06-06 ENCOUNTER — Ambulatory Visit: Payer: 59 | Admitting: Family

## 2019-06-06 ENCOUNTER — Other Ambulatory Visit: Payer: Self-pay

## 2019-06-06 ENCOUNTER — Encounter: Payer: Self-pay | Admitting: Family

## 2019-06-06 VITALS — BP 125/77 | HR 82 | Temp 97.4°F | Resp 16 | Ht 68.0 in | Wt 260.0 lb

## 2019-06-06 DIAGNOSIS — F419 Anxiety disorder, unspecified: Secondary | ICD-10-CM

## 2019-06-06 DIAGNOSIS — F329 Major depressive disorder, single episode, unspecified: Secondary | ICD-10-CM | POA: Diagnosis not present

## 2019-06-06 DIAGNOSIS — E785 Hyperlipidemia, unspecified: Secondary | ICD-10-CM | POA: Diagnosis not present

## 2019-06-06 DIAGNOSIS — F32A Depression, unspecified: Secondary | ICD-10-CM

## 2019-06-06 MED ORDER — ROSUVASTATIN CALCIUM 20 MG PO TABS: 20.0000 mg | ORAL_TABLET | Freq: Every day | ORAL | 1 refills | Status: DC

## 2019-06-06 MED ORDER — CLARITIN-D 12 HOUR 5-120 MG PO TB12: 1.0000 | ORAL_TABLET | Freq: Two times a day (BID) | ORAL | Status: AC

## 2019-06-06 MED ORDER — ESCITALOPRAM OXALATE 10 MG PO TABS: 10.0000 mg | ORAL_TABLET | Freq: Every day | ORAL | 1 refills | Status: DC

## 2019-06-06 NOTE — Patient Instructions (Signed)
Please complete lab work prior to leaving.   

## 2019-06-06 NOTE — Progress Notes (Signed)
   Subjective:    Patient ID: Chad Ward, male    DOB: 1975/05/28, 44 y.o.   MRN: [id number]  HPI   Patient is a 44 year old male who presents today for follow-up.  Anxiety- Maintained on lexapro. Reports anxiety symptoms are well controlled.  He has had significant weight gain since starting Lexapro.  He reports that he attributes this to poor eating habits and lack of activity during the pandemic.  Wt Readings from Last 3 Encounters:  06/06/19 260 lb (117.9 kg)  07/22/18 240 lb (108.9 kg)  04/22/18 227 lb (103 kg)   GERD- uses pepcid prn.  Reports symptoms are well controlled on this regimen.  Hyperlipidemia-maintained on Crestor 20 mg.  Lab Results  Component Value Date   CHOL 208 (H) 03/14/2018   HDL 40.70 03/14/2018   LDLCALC 134 (H) 03/14/2018   TRIG 169.0 (H) 03/14/2018   CHOLHDL 5 03/14/2018        Objective:   Physical Exam Constitutional:      General: He is not in acute distress.    Appearance: He is well-developed.  HENT:     Head: Normocephalic and atraumatic.  Cardiovascular:     Rate and Rhythm: Normal rate and regular rhythm.     Heart sounds: No murmur.  Pulmonary:     Effort: Pulmonary effort is normal. No respiratory distress.     Breath sounds: Normal breath sounds. No wheezing or rales.  Skin:    General: Skin is warm and dry.  Neurological:     Mental Status: He is alert and oriented to person, place, and time.  Psychiatric:        Behavior: Behavior normal.        Thought Content: Thought content normal.           Assessment & Plan:  Anxiety/depression-his anxiety symptoms are well controlled mood is good.  I am quite concerned about the amount of weight that he is gained.  I did suggest that we consider changing his medication.  He does not wish to change medication at this time since he is feeling well we ultimately decided to bring him back in 1 month for weight recheck.  If he has gained further weight need to discontinue  Lexapro.  We discussed importance of healthy diet and exercise.  Hyperlipidemia-tolerating Crestor.  Obtain follow-up c-Met and lipid panel.  GERD-stable on Pepcid.  Continue same.  This visit occurred during the SARS-CoV-2 public health emergency.  Safety protocols were in place, including screening questions prior to the visit, additional usage of staff PPE, and extensive cleaning of exam room while observing appropriate contact time as indicated for disinfecting solutions.

## 2019-06-07 LAB — LIPID PANEL
Cholesterol: 168 mg/dL (ref 0–200)
HDL: 36 mg/dL — ABNORMAL LOW (ref >39.00)
NonHDL: 132.35
Total CHOL/HDL Ratio: 5
Triglycerides: 239 mg/dL — ABNORMAL HIGH (ref 0.0–149.0)
VLDL: 47.8 mg/dL — ABNORMAL HIGH (ref 0.0–40.0)

## 2019-06-07 LAB — COMPREHENSIVE METABOLIC PANEL
ALT: 37 U/L (ref 0–53)
AST: 23 U/L (ref 0–37)
Albumin: 4.7 g/dL (ref 3.5–5.2)
Alkaline Phosphatase: 95 U/L (ref 39–117)
BUN: 18 mg/dL (ref 6–23)
CO2: 27 mEq/L (ref 19–32)
Calcium: 9.4 mg/dL (ref 8.4–10.5)
Chloride: 103 mEq/L (ref 96–112)
Creatinine, Ser: 1.09 mg/dL (ref 0.40–1.50)
GFR: 73.97 mL/min (ref >60.00)
Glucose, Bld: 97 mg/dL (ref 70–99)
Potassium: 4.1 mEq/L (ref 3.5–5.1)
Sodium: 139 mEq/L (ref 135–145)
Total Bilirubin: 0.6 mg/dL (ref 0.2–1.2)
Total Protein: 7.2 g/dL (ref 6.0–8.3)

## 2019-06-07 LAB — LDL CHOLESTEROL, DIRECT: Direct LDL: 98 mg/dL

## 2019-06-23 MED FILL — ESCITALOPRAM 10 MG TABLET: 10 | 90 days supply | Qty: 90 | Fill #0

## 2019-06-23 MED FILL — ROSUVASTATIN CALCIUM 20 MG: 20 | 90 days supply | Qty: 90 | Fill #0

## 2019-07-07 ENCOUNTER — Ambulatory Visit: Payer: 59 | Admitting: Family

## 2019-08-07 ENCOUNTER — Ambulatory Visit: Payer: 59 | Admitting: Family

## 2019-08-07 ENCOUNTER — Encounter: Payer: Self-pay | Admitting: Family

## 2019-08-07 DIAGNOSIS — Z0289 Encounter for other administrative examinations: Secondary | ICD-10-CM

## 2019-09-25 MED FILL — ESCITALOPRAM 10 MG TABLET: 10 | 90 days supply | Qty: 90 | Fill #1

## 2019-10-02 DIAGNOSIS — J029 Acute pharyngitis, unspecified: Secondary | ICD-10-CM | POA: Diagnosis not present

## 2019-10-02 DIAGNOSIS — R05 Cough: Secondary | ICD-10-CM | POA: Diagnosis not present

## 2019-10-02 DIAGNOSIS — Z20822 Contact with and (suspected) exposure to covid-19: Secondary | ICD-10-CM | POA: Diagnosis not present

## 2019-10-02 DIAGNOSIS — R0981 Nasal congestion: Secondary | ICD-10-CM | POA: Diagnosis not present

## 2019-10-20 DIAGNOSIS — Z03818 Encounter for observation for suspected exposure to other biological agents ruled out: Secondary | ICD-10-CM | POA: Diagnosis not present

## 2019-10-22 DIAGNOSIS — Z20828 Contact with and (suspected) exposure to other viral communicable diseases: Secondary | ICD-10-CM | POA: Diagnosis not present

## 2020-01-04 ENCOUNTER — Encounter: Payer: Self-pay | Admitting: Family

## 2020-01-05 ENCOUNTER — Other Ambulatory Visit: Payer: Self-pay | Admitting: Family

## 2020-01-05 MED ORDER — ESCITALOPRAM OXALATE 10 MG PO TABS: 10.0000 mg | ORAL_TABLET | Freq: Every day | ORAL | 0 refills | Status: DC

## 2020-01-05 MED FILL — ESCITALOPRAM 10 MG TABLET: 10 | 90 days supply | Qty: 90 | Fill #0

## 2020-01-11 ENCOUNTER — Encounter: Payer: Self-pay | Admitting: Family

## 2020-01-12 ENCOUNTER — Ambulatory Visit: Payer: 59 | Admitting: Family

## 2020-02-21 DIAGNOSIS — Z03818 Encounter for observation for suspected exposure to other biological agents ruled out: Secondary | ICD-10-CM | POA: Diagnosis not present

## 2020-02-23 ENCOUNTER — Encounter: Payer: Self-pay | Admitting: Family

## 2020-02-26 MED ORDER — ROSUVASTATIN CALCIUM 20 MG PO TABS: 20.0000 mg | ORAL_TABLET | Freq: Every day | ORAL | 1 refills | Status: DC

## 2020-03-07 DIAGNOSIS — Z20828 Contact with and (suspected) exposure to other viral communicable diseases: Secondary | ICD-10-CM | POA: Diagnosis not present

## 2020-03-07 DIAGNOSIS — Z20822 Contact with and (suspected) exposure to covid-19: Secondary | ICD-10-CM | POA: Diagnosis not present

## 2020-04-05 ENCOUNTER — Other Ambulatory Visit: Payer: Self-pay | Admitting: Family

## 2020-04-05 ENCOUNTER — Other Ambulatory Visit (HOSPITAL_COMMUNITY): Payer: Self-pay | Admitting: Family

## 2020-04-05 MED ORDER — ESCITALOPRAM OXALATE 10 MG PO TABS: 10.0000 mg | ORAL_TABLET | Freq: Every day | ORAL | 0 refills | Status: DC

## 2020-04-05 MED FILL — ROSUVASTATIN CALCIUM 20 MG: 20 | 90 days supply | Qty: 90 | Fill #0

## 2020-04-05 MED FILL — ESCITALOPRAM 10 MG TABLET: 10 | 30 days supply | Qty: 30 | Fill #0

## 2020-05-08 ENCOUNTER — Encounter: Payer: Self-pay | Admitting: Family

## 2020-05-08 ENCOUNTER — Other Ambulatory Visit (HOSPITAL_COMMUNITY): Payer: Self-pay | Admitting: Family

## 2020-05-08 ENCOUNTER — Ambulatory Visit: Payer: 59 | Admitting: Family

## 2020-05-08 ENCOUNTER — Other Ambulatory Visit: Payer: Self-pay

## 2020-05-08 VITALS — BP 112/67 | HR 75 | Temp 98.2°F | Resp 16 | Ht 68.0 in | Wt 255.0 lb

## 2020-05-08 DIAGNOSIS — E669 Obesity, unspecified: Secondary | ICD-10-CM

## 2020-05-08 DIAGNOSIS — E785 Hyperlipidemia, unspecified: Secondary | ICD-10-CM

## 2020-05-08 DIAGNOSIS — F419 Anxiety disorder, unspecified: Secondary | ICD-10-CM | POA: Diagnosis not present

## 2020-05-08 LAB — COMPREHENSIVE METABOLIC PANEL
ALT: 35 U/L (ref 0–53)
AST: 22 U/L (ref 0–37)
Albumin: 4.8 g/dL (ref 3.5–5.2)
Alkaline Phosphatase: 88 U/L (ref 39–117)
BUN: 24 mg/dL — ABNORMAL HIGH (ref 6–23)
CO2: 29 mEq/L (ref 19–32)
Calcium: 9.5 mg/dL (ref 8.4–10.5)
Chloride: 105 mEq/L (ref 96–112)
Creatinine, Ser: 1.11 mg/dL (ref 0.40–1.50)
GFR: 81.31 mL/min (ref >60.00)
Glucose, Bld: 86 mg/dL (ref 70–99)
Potassium: 4 mEq/L (ref 3.5–5.1)
Sodium: 141 mEq/L (ref 135–145)
Total Bilirubin: 0.9 mg/dL (ref 0.2–1.2)
Total Protein: 7.7 g/dL (ref 6.0–8.3)

## 2020-05-08 LAB — LIPID PANEL
Cholesterol: 143 mg/dL (ref 0–200)
HDL: 35.5 mg/dL — ABNORMAL LOW (ref >39.00)
LDL Cholesterol: 80 mg/dL (ref 0–99)
NonHDL: 107.58
Total CHOL/HDL Ratio: 4
Triglycerides: 137 mg/dL (ref 0.0–149.0)
VLDL: 27.4 mg/dL (ref 0.0–40.0)

## 2020-05-08 MED ORDER — ESCITALOPRAM OXALATE 10 MG PO TABS: 10.0000 mg | ORAL_TABLET | Freq: Every day | ORAL | 1 refills | Status: DC

## 2020-05-08 NOTE — Progress Notes (Signed)
Subjective:    Patient ID: Chad Ward, male    DOB: Feb 27, 1975, 45 y.o.   MRN: [id number]   Patient is a 45 yr old male who presents today for follow up.  Hyperlipidemia Pertinent negatives include no chest pain or shortness of breath.  Anxiety Symptoms include nervous/anxious behavior. Patient reports no chest pain, nausea or shortness of breath.    He is still taking Lexpro and it is working well.    Hyperlipidemia  He reports that he is maintaining well with Crestor 20 mg daily.   Weight He reports that he is currently completing intermittent fasting.  Last Weight  Most recent update: 05/08/2020  8:33 AM   Weight  115.7 kg (255 lb)              Review of Systems  Constitutional: Negative for chills, fatigue and fever.  HENT: Negative for rhinorrhea.   Respiratory: Negative for cough and shortness of breath.   Cardiovascular: Negative for chest pain.  Gastrointestinal: Negative for abdominal pain, diarrhea, nausea and vomiting.  Musculoskeletal: Negative for back pain and neck pain.  Psychiatric/Behavioral: The patient is nervous/anxious.    Past Medical History:  Diagnosis Date  . Allergy   . Diverticulitis   . GERD (gastroesophageal reflux disease)   . History of chicken pox   . IBS (irritable bowel syndrome)   . Meniere's disease      Social History   Socioeconomic History  . Marital status: Married    Spouse name: Not on file  . Number of children: 3  . Years of education: Not on file  . Highest education level: Not on file  Occupational History  . Not on file  Tobacco Use  . Smoking status: Never Smoker  . Smokeless tobacco: Never Used  Vaping Use  . Vaping Use: Never used  Substance and Sexual Activity  . Alcohol use: No  . Drug use: No  . Sexual activity: Yes    Partners: Female  Other Topics Concern  . 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 TONSILLECTOMY AND ADENOIDECTOMY  1988  . VASECTOMY N/A 09/26/2014   Procedure: VASECTOMY;  Surgeon: Hollice Espy, MD;  Location: [facility] ORS;  Service: Urology;  Laterality: N/A;  . WISDOM TOOTH EXTRACTION      Family History  Problem Relation Age of Onset  . Hypertension Mother   . Diverticulitis Mother   . Colitis Mother   . Irritable bowel syndrome Mother   . Stroke Mother   . Hypertension Father   . Heart attack Father   . Cancer Maternal Grandfather   . Diverticulitis Maternal Uncle   . Irritable bowel syndrome Maternal Aunt   . Prostate cancer Neg Hx   . Kidney cancer Neg Hx   . Bladder Cancer Neg Hx   . Hearing loss Neg Hx   . Colon cancer Neg Hx   . Esophageal cancer Neg Hx   . Rectal cancer Neg Hx   .  Stomach cancer Neg Hx     No Known Allergies  Current Outpatient Medications on File Prior to Visit  Medication Sig Dispense Refill  . calcium carbonate (TUMS - DOSED IN MG ELEMENTAL CALCIUM) 500 MG chewable tablet Chew 1 tablet by mouth as needed for indigestion or heartburn.    . escitalopram (LEXAPRO) 10 MG tablet Take 1 tablet (10 mg total) by mouth daily. 30 tablet 0  . fluticasone (FLONASE) 50 MCG/ACT nasal spray Place 2 sprays into both nostrils daily. 16 g 6  . loratadine-pseudoephedrine (CLARITIN-D 12 HOUR) 5-120 MG tablet Take 1 tablet by mouth 2 (two) times daily.    . Probiotic Product (PROBIOTIC-10 PO) Take by mouth.    . rosuvastatin (CRESTOR) 20 MG tablet Take 1 tablet (20 mg total) by mouth daily. 90 tablet 1  . hydrOXYzine (ATARAX/VISTARIL) 25 MG tablet Take 1 tablet (25 mg total) by mouth 3  (three) times daily as needed for anxiety. (Patient not taking: Reported on 05/08/2020) 30 tablet 2   No current facility-administered medications on file prior to visit.    BP 112/67 (BP Location: Right Arm, Patient Position: Sitting, Cuff Size: Large)   Pulse 75   Temp 98.2 F (36.8 C) (Oral)   Resp 16   Ht 5\' 8"  (1.727 m)   Wt 255 lb (115.7 kg)   SpO2 99%   BMI 38.77 kg/m      Objective:   Physical Exam Constitutional:      General: He is not in acute distress.    Appearance: Normal appearance. He is not ill-appearing.  HENT:     Head: Normocephalic and atraumatic.     Right Ear: External ear normal.     Left Ear: External ear normal.  Cardiovascular:     Rate and Rhythm: Normal rate and regular rhythm.     Pulses: Normal pulses.     Heart sounds: Normal heart sounds. No murmur heard.   Pulmonary:     Effort: No respiratory distress.     Breath sounds: Normal breath sounds. No wheezing or rales.  Musculoskeletal:     Cervical back: Normal range of motion.  Skin:    General: Skin is warm.  Neurological:     Mental Status: He is oriented to person, place, and time.  Psychiatric:        Behavior: Behavior normal.           Assessment & Plan:  Anxiety Well controlled  On Lexpro 10 mg daily  Continue same dose  Hyperlipidemia Contin Crestor 20 mg daily  Obtain follow up lipid panel   Obesity  He has lost 5 pounds since his last visit and I commended him on that. Discussed diet and exercise  Patient is currently working on intermittent fasting   I,Alexis Bryant,acting as a Education administrator for [employer], NP.,have documented all relevant documentation on the behalf of Nance Pear, NP,as directed by  Nance Pear, NP while in the presence of Nance Pear, NP.

## 2020-06-24 ENCOUNTER — Encounter: Payer: Self-pay | Admitting: Family

## 2020-06-24 ENCOUNTER — Ambulatory Visit: Payer: 59 | Admitting: Family

## 2020-06-24 ENCOUNTER — Other Ambulatory Visit: Payer: Self-pay

## 2020-06-24 DIAGNOSIS — Z0000 Encounter for general adult medical examination without abnormal findings: Secondary | ICD-10-CM | POA: Diagnosis not present

## 2020-06-24 NOTE — Assessment & Plan Note (Signed)
Encouraged pt to continue to work on healthy diet, exercise and weight loss. Immunizations reviewed and up to date. Pt had colonoscopy. Recommended that he schedule routine vision and dental exams. Labs are up to date.

## 2020-06-24 NOTE — Progress Notes (Signed)
Subjective:   By signing my name below, I, Shehryar Baig, attest that this documentation has been prepared under the direction and in the presence of [name], NP. 06/24/2020   Patient ID: Chad Ward, male    DOB: [birth year], 45 y.o.   MRN: [id number]  No chief complaint on file.   HPI Patient is in today for a office visit. He reports that he is doing well at this time. He denies having any cough, cold symptoms, diarrhea, constipation, burning, frequency, muscle pain, joint pain, skin changes, headaches, swollen glands, depression, anxiety. He does not smoke, drink alcohol, or use drugs. 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  Immunizations: He is UTD on Covid 19 vaccinations. He is UTD on tetanus vaccinations. He is UTD on flu vaccinations. Diet: He is eating healthy except during vacations. Exercise: He reports that he does not exercise at this time, but he is planning to participate in tennis in the future. Colonoscopy: 01/07/2018. 1 polyp, diverticulosis, and significant looping of the colon were noted. Repeat at age 49. Prostate screening: Not yet completed. Dexa: Not yet completed Dental: Not UTD on dental. Vision: Not UTD on vision.    Past Medical History:  Diagnosis Date  . Allergy   . Diverticulitis   . GERD (gastroesophageal reflux disease)   . History of chicken pox   . IBS (irritable bowel syndrome)   . Meniere's disease     Past Surgical History:  Procedure Laterality Date  . TONSILLECTOMY AND ADENOIDECTOMY  1988  . VASECTOMY N/A 09/26/2014   Procedure: VASECTOMY;  Surgeon: Hollice Espy, MD;  Location: [facility] ORS;  Service: Urology;  Laterality: N/A;  . WISDOM TOOTH EXTRACTION      Family History  Problem Relation Age of Onset  . Hypertension Mother   . Diverticulitis Mother   . Colitis Mother   . Irritable bowel syndrome Mother   . Stroke Mother   . Hypertension Father   . Heart attack Father   . Cancer Maternal Grandfather   .  Diverticulitis Maternal Uncle   . Irritable bowel syndrome Maternal Aunt   . Prostate cancer Neg Hx   . Kidney cancer Neg Hx   . Bladder Cancer Neg Hx   . Hearing loss Neg Hx   . Colon cancer Neg Hx   . Esophageal cancer Neg Hx   . Rectal cancer Neg Hx   . Stomach cancer Neg Hx     Social History   Socioeconomic History  . Marital status: Married    Spouse name: Not on file  . Number of children: 3  . Years of education: Not on file  . Highest education level: Not on file  Occupational History  . Not on file  Tobacco Use  . Smoking status: Never Smoker  . Smokeless tobacco: Never Used  Vaping Use  . Vaping Use: Never used  Substance and Sexual Activity  . Alcohol use: No  . Drug use: No  . Sexual activity: Yes    Partners: Female  Other Topics Concern  . 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 calcium carbonate (TUMS - DOSED IN MG ELEMENTAL CALCIUM) 500 MG chewable tablet Chew 1 tablet by mouth as needed for indigestion or heartburn.    . escitalopram (LEXAPRO) 10 MG tablet Take 1 tablet (10 mg total) by mouth daily. 90 tablet 1  . escitalopram (LEXAPRO) 10 MG tablet TAKE 1 TABLET (10 MG TOTAL) BY MOUTH DAILY. 90 tablet 1  . escitalopram (LEXAPRO) 10 MG tablet TAKE 1 TABLET BY MOUTH ONCE DAILY 30 tablet 0  . FLUCELVAX QUADRIVALENT 0.5 ML injection     . fluticasone (FLONASE) 50 MCG/ACT nasal spray Place 2 sprays into both nostrils daily. 16 g 6  . loratadine-pseudoephedrine (CLARITIN-D 12 HOUR) 5-120 MG  tablet Take 1 tablet by mouth 2 (two) times daily.    Marland Kitchen MODERNA COVID-19 VACCINE 100 MCG/0.5ML injection     . Probiotic Product (PROBIOTIC-10 PO) Take by mouth.    . rosuvastatin (CRESTOR) 20 MG tablet Take 1 tablet (20 mg total) by mouth daily. 90 tablet 1  . rosuvastatin (CRESTOR) 20 MG tablet TAKE 1 TABLET BY MOUTH ONCE DAILY 90 tablet 1   No facility-administered medications prior to visit.    No Known Allergies  Review of Systems  HENT: Negative for congestion.   Respiratory: Negative for cough.   Gastrointestinal: Negative for constipation and diarrhea.  Genitourinary: Negative for dysuria and frequency.  Skin: Negative for rash.       (-)Skin changes  Psychiatric/Behavioral: Negative for depression. The patient is not nervous/anxious.        Objective:    Physical Exam Constitutional:      Appearance: Normal appearance.  HENT:     Head: Normocephalic.     Right Ear: Tympanic membrane and external ear normal.     Left Ear: Tympanic membrane and external ear normal.  Eyes:     General: No scleral icterus.    Extraocular Movements: Extraocular movements intact.     Conjunctiva/sclera: Conjunctivae normal.     Pupils: Pupils are equal, round, and reactive to light.     Comments: No nystagmus.  Cardiovascular:     Rate and Rhythm: Normal rate and regular rhythm.     Pulses: Normal pulses.     Heart sounds: Normal heart sounds.  Pulmonary:     Effort: Pulmonary effort is normal.     Breath sounds: Normal breath sounds.  Abdominal:     General: There is no distension.     Palpations: Abdomen is soft. There is no mass.  Musculoskeletal:        General: No swelling.     Cervical back: Neck supple. No tenderness.     Comments: 5/5 strength in both upper and lower extremities.  Skin:    General: Skin is warm and dry.  Neurological:     General: No focal deficit present.     Mental Status: He is alert and oriented to person, place, and time.     Deep Tendon  Reflexes:     Reflex Scores:      Patellar reflexes are 2+ on the right side and 2+ on the left side. Psychiatric:        Attention and Perception: Attention normal.        Mood and Affect: Mood and affect normal.        Speech: Speech normal.  Behavior: Behavior normal.     There were no vitals taken for this visit. Wt Readings from Last 3 Encounters:  05/08/20 255 lb (115.7 kg)  06/06/19 260 lb (117.9 kg)  07/22/18 240 lb (108.9 kg)    Diabetic Foot Exam - Simple   No data filed    Lab Results  Component Value Date   WBC 8.3 07/30/2017   HGB 15.4 07/30/2017   HCT 46.3 07/30/2017   PLT 274 07/30/2017   GLUCOSE 86 05/08/2020   CHOL 143 05/08/2020   TRIG 137.0 05/08/2020   HDL 35.50 (L) 05/08/2020   LDLDIRECT 98.0 06/06/2019   LDLCALC 80 05/08/2020   ALT 35 05/08/2020   AST 22 05/08/2020   NA 141 05/08/2020   K 4.0 05/08/2020   CL 105 05/08/2020   CREATININE 1.11 05/08/2020   BUN 24 (H) 05/08/2020   CO2 29 05/08/2020   TSH 2.40 07/30/2017    Lab Results  Component Value Date   TSH 2.40 07/30/2017   Lab Results  Component Value Date   WBC 8.3 07/30/2017   HGB 15.4 07/30/2017   HCT 46.3 07/30/2017   MCV 76.9 (L) 07/30/2017   PLT 274 07/30/2017   Lab Results  Component Value Date   NA 141 05/08/2020   K 4.0 05/08/2020   CO2 29 05/08/2020   GLUCOSE 86 05/08/2020   BUN 24 (H) 05/08/2020   CREATININE 1.11 05/08/2020   BILITOT 0.9 05/08/2020   ALKPHOS 88 05/08/2020   AST 22 05/08/2020   ALT 35 05/08/2020   PROT 7.7 05/08/2020   ALBUMIN 4.8 05/08/2020   CALCIUM 9.5 05/08/2020   GFR 81.31 05/08/2020   Lab Results  Component Value Date   CHOL 143 05/08/2020   Lab Results  Component Value Date   HDL 35.50 (L) 05/08/2020   Lab Results  Component Value Date   LDLCALC 80 05/08/2020   Lab Results  Component Value Date   TRIG 137.0 05/08/2020   Lab Results  Component Value Date   CHOLHDL 4 05/08/2020   No results found for: HGBA1C      Assessment & Plan:   Problem List Items Addressed This Visit   None      No orders of the defined types were placed in this encounter.   I,Shehryar Multimedia programmer as a Education administrator for [employer], NP.,have documented all relevant documentation on the behalf of Nance Pear, NP,as directed by  Nance Pear, NP while in the presence of Nance Pear, NP.   Shehryar Rae Roam NP, personally preformed the services described in this documentation.  All medical record entries made by the scribe were at my direction and in my presence.  I have reviewed the chart and discharge instructions (if applicable) and agree that the record reflects my personal performance and is accurate and complete. 06/24/2020

## 2020-06-24 NOTE — Patient Instructions (Addendum)
Please schedule a routine eye exam and dental visit.  Please continue to work on healthy diet, exercise and weight loss.

## 2020-07-29 ENCOUNTER — Other Ambulatory Visit: Payer: Self-pay | Admitting: Family

## 2020-07-29 ENCOUNTER — Other Ambulatory Visit (HOSPITAL_COMMUNITY): Payer: Self-pay

## 2020-07-29 MED ORDER — ROSUVASTATIN CALCIUM 20 MG PO TABS
20.0000 mg | ORAL_TABLET | Freq: Every day | ORAL | 1 refills | Status: DC
  Filled 2020-07-29: qty 90, 90d supply, fill #0
  Filled 2021-02-12: qty 90, 90d supply, fill #1

## 2020-08-02 ENCOUNTER — Other Ambulatory Visit (HOSPITAL_COMMUNITY): Payer: Self-pay

## 2020-08-09 ENCOUNTER — Other Ambulatory Visit: Payer: Self-pay | Admitting: Family

## 2020-08-09 ENCOUNTER — Other Ambulatory Visit (HOSPITAL_COMMUNITY): Payer: Self-pay

## 2020-08-09 MED FILL — Escitalopram Oxalate Tab 10 MG (Base Equiv): ORAL | 90 days supply | Qty: 90 | Fill #0 | Status: AC

## 2020-10-30 ENCOUNTER — Other Ambulatory Visit (HOSPITAL_COMMUNITY): Payer: Self-pay

## 2020-10-30 MED ORDER — CARESTART COVID-19 HOME TEST VI KIT
PACK | 0 refills | Status: DC
  Filled 2020-10-30: qty 4, 4d supply, fill #0

## 2020-11-11 ENCOUNTER — Other Ambulatory Visit (HOSPITAL_BASED_OUTPATIENT_CLINIC_OR_DEPARTMENT_OTHER): Payer: Self-pay

## 2020-11-11 ENCOUNTER — Encounter: Payer: Self-pay | Admitting: Family

## 2020-11-11 MED ORDER — ESCITALOPRAM OXALATE 10 MG PO TABS
10.0000 mg | ORAL_TABLET | Freq: Every day | ORAL | 0 refills | Status: DC
  Filled 2020-11-11: qty 90, 90d supply, fill #0

## 2020-12-25 ENCOUNTER — Ambulatory Visit: Payer: 59 | Admitting: Family

## 2021-02-04 ENCOUNTER — Other Ambulatory Visit (HOSPITAL_COMMUNITY): Payer: Self-pay

## 2021-02-04 MED ORDER — CARESTART COVID-19 HOME TEST VI KIT
PACK | 0 refills | Status: DC
  Filled 2021-02-04: qty 4, 4d supply, fill #0

## 2021-02-10 ENCOUNTER — Encounter: Payer: Self-pay | Admitting: Family

## 2021-02-11 ENCOUNTER — Other Ambulatory Visit (HOSPITAL_COMMUNITY): Payer: Self-pay

## 2021-02-11 MED ORDER — ESCITALOPRAM OXALATE 10 MG PO TABS
10.0000 mg | ORAL_TABLET | Freq: Every day | ORAL | 0 refills | Status: DC
  Filled 2021-02-11 – 2021-02-12 (×2): qty 30, 30d supply, fill #0

## 2021-02-12 ENCOUNTER — Other Ambulatory Visit (HOSPITAL_BASED_OUTPATIENT_CLINIC_OR_DEPARTMENT_OTHER): Payer: Self-pay

## 2021-02-13 ENCOUNTER — Other Ambulatory Visit (HOSPITAL_COMMUNITY): Payer: Self-pay

## 2021-03-17 ENCOUNTER — Encounter: Payer: Self-pay | Admitting: Family

## 2021-03-17 ENCOUNTER — Other Ambulatory Visit (HOSPITAL_BASED_OUTPATIENT_CLINIC_OR_DEPARTMENT_OTHER): Payer: Self-pay

## 2021-03-17 ENCOUNTER — Ambulatory Visit: Payer: 59 | Admitting: Family

## 2021-03-17 VITALS — BP 129/70 | HR 78 | Temp 98.6°F | Resp 16 | Wt 267.0 lb

## 2021-03-17 DIAGNOSIS — E785 Hyperlipidemia, unspecified: Secondary | ICD-10-CM | POA: Diagnosis not present

## 2021-03-17 DIAGNOSIS — F419 Anxiety disorder, unspecified: Secondary | ICD-10-CM | POA: Diagnosis not present

## 2021-03-17 DIAGNOSIS — Z23 Encounter for immunization: Secondary | ICD-10-CM | POA: Diagnosis not present

## 2021-03-17 DIAGNOSIS — R635 Abnormal weight gain: Secondary | ICD-10-CM

## 2021-03-17 MED ORDER — ATORVASTATIN CALCIUM 20 MG PO TABS
20.0000 mg | ORAL_TABLET | Freq: Every day | ORAL | 1 refills | Status: DC
  Filled 2021-03-17: qty 90, 90d supply, fill #0
  Filled 2021-06-19: qty 90, 90d supply, fill #1

## 2021-03-17 MED ORDER — ESCITALOPRAM OXALATE 10 MG PO TABS
10.0000 mg | ORAL_TABLET | Freq: Every day | ORAL | 1 refills | Status: DC
  Filled 2021-03-17: qty 90, 90d supply, fill #0
  Filled 2021-06-16 – 2021-06-19 (×2): qty 90, 90d supply, fill #1

## 2021-03-17 NOTE — Progress Notes (Signed)
Subjective:     Patient ID: Chad Ward, male    DOB: [birth year], 46 y.o.   MRN: [id number]  Chief Complaint  Patient presents with   Anxiety    Here for follow up   Nasal Congestion    Complains of congestion neg covid test last week    Anxiety     Anxiety- maintained on lexapro 66m once daily.   Wt Readings from Last 3 Encounters:  03/17/21 267 lb (121.1 kg)  06/24/20 251 lb (113.9 kg)  05/08/20 255 lb (115.7 kg)    Reports not exercising much.  Has been making poor food choices on the go with his kids and their activities.   Hyperlipidemia-  Lab Results  Component Value Date   CHOL 143 05/08/2020   HDL 35.50 (L) 05/08/2020   LDLCALC 80 05/08/2020   LDLDIRECT 98.0 06/06/2019   TRIG 137.0 05/08/2020   CHOLHDL 4 05/08/2020   Maintained on crestor 276monce daily.   Reports resolving URI symptoms- started about 10 days ago and now nearly resolved.   Health Maintenance Due  Topic Date Due   Hepatitis C Screening  Never done   COVID-19 Vaccine (3 - Booster for Moderna series) 02/24/2020   INFLUENZA VACCINE  09/23/2020    Past Medical History:  Diagnosis Date   Allergy    Diverticulitis    GERD (gastroesophageal reflux disease)    History of chicken pox    IBS (irritable bowel syndrome)    Meniere's disease     Past Surgical History:  Procedure Laterality Date   [place]/A 09/26/2014   Procedure: VASECTOMY;  Surgeon: Chad EspyMD;  Location: [facility] ORS;  Service: Urology;  Laterality: N/A;   WISDOM TOOTH EXTRACTION      Family History  Problem Relation Age of Onset   Hypertension Mother    Diverticulitis Mother    Colitis Mother    Irritable bowel syndrome Mother    Stroke Mother    Hypertension Father    Heart attack Father    Cancer Maternal Grandfather    Diverticulitis Maternal Uncle    Irritable bowel syndrome Maternal Aunt    Prostate cancer Neg Hx    Kidney cancer Neg Hx    Bladder  Cancer Neg Hx    Hearing loss Neg Hx    Colon cancer Neg Hx    Esophageal cancer Neg Hx    Rectal cancer Neg Hx    Stomach cancer Neg Hx     Social History   Socioeconomic History   Marital status: Married    Spouse name: Not on file   Number of children: 3   Years of education: Not on file   Highest education level: Not on file  Occupational History   Not on file  Tobacco Use   Smoking status: Never   Smokeless tobacco: Never  Vaping Use   Vaping Use: Never used  Substance and Sexual Activity   Alcohol use: No   Drug use: No   Sexual activity: Yes    Partners: Female  Other Topics Concern   Not on file  Social History Narrative   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     Probiotic Product (PROBIOTIC-10 PO) Take by mouth.     escitalopram (LEXAPRO) 10 MG tablet Take 1 tablet by mouth daily. Please make appointment for further refills. 30 tablet 0   rosuvastatin (CRESTOR) 20 MG tablet Take 1 tablet (20 mg total) by mouth daily.  **Need to make an appointment** 90 tablet 1   COVID-19 At Home Antigen Test (CARESTART COVID-19 HOME TEST) KIT Use as directed within package instructions. 4 each 0   COVID-19 At Home Antigen Test (CARESTART COVID-19 HOME TEST) KIT Use as Directed within package instructions. 4 each 0   FLUCELVAX QUADRIVALENT 0.5 ML injection      fluticasone (FLONASE) 50 MCG/ACT nasal spray Place 2 sprays into both nostrils daily.  16 g 6   MODERNA COVID-19 VACCINE 100 MCG/0.5ML injection      No facility-administered medications prior to visit.    No Known Allergies  ROS See HPI    Objective:    Physical Exam Constitutional:      General: He is not in acute distress.    Appearance: He is well-developed.  HENT:     Head: Normocephalic and atraumatic.  Cardiovascular:     Rate and Rhythm: Normal rate and regular rhythm.     Heart sounds: No murmur heard. Pulmonary:     Effort: Pulmonary effort is normal. No respiratory distress.     Breath sounds: Normal breath sounds. No wheezing or rales.  Skin:    General: Skin is warm and dry.  Neurological:     Mental Status: He is alert and oriented to person, place, and time.  Psychiatric:        Behavior: Behavior normal.        Thought Content: Thought content normal.    BP 129/70 (BP Location: Right Arm, Patient Position: Sitting, Cuff Size: Large)    Pulse 78    Temp 98.6 F (37 C) (Oral)    Resp 16    Wt 267 lb (121.1 kg)    SpO2 97%    BMI 40.60 kg/m  Wt Readings from Last 3 Encounters:  03/17/21 267 lb (121.1 kg)  06/24/20 251 lb (113.9 kg)  05/08/20 255 lb (115.7 kg)       Assessment & Plan:   Problem List Items Addressed This Visit       Unprioritized   Weight gain    Discussed healthy diet, exercise and weight loss.       Dyslipidemia    Lipids at goal. Continue crestor.       Relevant Medications   atorvastatin (LIPITOR) 20 MG tablet   Anxiety    Stable on lexapro 35m once daily. Continue same.       Relevant Medications   escitalopram (LEXAPRO) 10 MG tablet   Flu shot today. Counseled on Bivalent covid booster- he will think about it.   I have discontinued Chad Ward "Chad Ward"'s fluticasone, Moderna COVID-19 Vaccine, Flucelvax Quadrivalent, rosuvastatin, Carestart COVID-19 Home Test, and Carestart COVID-19 Home Test. I am also having him start on atorvastatin. Additionally, I am having him maintain his Probiotic Product  (PROBIOTIC-10 PO), Claritin-D 12 Hour, and escitalopram.  Meds ordered this encounter  Medications   escitalopram (LEXAPRO) 10 MG tablet    Sig: Take 1 tablet by mouth daily. Please make appointment for further refills.    Dispense:  90 tablet    Refill:  1    Requested drug refills are authorized, however, the patient needs further evaluation and/or laboratory testing before further refills are given. Ask him to make an appointment for this.    Order Specific Question:   Supervising Provider    Answer:   Penni Homans A [[id number]]   atorvastatin (LIPITOR) 20 MG tablet    Sig: Take 1 tablet (20 mg total) by mouth daily.    Dispense:  90 tablet    Refill:  1    Order Specific Question:   Supervising Provider    Answer:   Penni Homans A [[id number]]

## 2021-03-17 NOTE — Assessment & Plan Note (Signed)
Discussed healthy diet, exercise and weight loss.

## 2021-03-17 NOTE — Assessment & Plan Note (Signed)
Stable on lexapro 10mg  once daily. Continue same.

## 2021-03-17 NOTE — Patient Instructions (Signed)
Please consider the Clinical research associate.  Please work on [employer], exercise, weight loss.

## 2021-03-17 NOTE — Assessment & Plan Note (Signed)
Lipids at goal. Continue crestor

## 2021-06-16 ENCOUNTER — Other Ambulatory Visit (HOSPITAL_BASED_OUTPATIENT_CLINIC_OR_DEPARTMENT_OTHER): Payer: Self-pay

## 2021-06-17 ENCOUNTER — Other Ambulatory Visit (HOSPITAL_COMMUNITY): Payer: Self-pay

## 2021-06-17 ENCOUNTER — Other Ambulatory Visit (HOSPITAL_BASED_OUTPATIENT_CLINIC_OR_DEPARTMENT_OTHER): Payer: Self-pay

## 2021-06-19 ENCOUNTER — Other Ambulatory Visit (HOSPITAL_COMMUNITY): Payer: Self-pay

## 2021-06-25 ENCOUNTER — Ambulatory Visit: Payer: 59 | Admitting: Family

## 2021-06-25 ENCOUNTER — Ambulatory Visit (HOSPITAL_BASED_OUTPATIENT_CLINIC_OR_DEPARTMENT_OTHER)
Admission: RE | Admit: 2021-06-25 | Discharge: 2021-06-25 | Disposition: A | Discharge status: Home / Self Care | Payer: 59 | Source: Ambulatory Visit | Attending: Family | Admitting: Family

## 2021-06-25 VITALS — BP 137/81 | HR 77 | Temp 98.2°F | Resp 16 | Wt 271.0 lb

## 2021-06-25 DIAGNOSIS — M79661 Pain in right lower leg: Secondary | ICD-10-CM | POA: Diagnosis not present

## 2021-06-25 NOTE — Assessment & Plan Note (Signed)
New. Will obtain RLE doppler to rule out DVT.   ?

## 2021-06-25 NOTE — Patient Instructions (Signed)
Please complete your ultrasound on the first floor.  ?

## 2021-06-25 NOTE — Progress Notes (Signed)
? ?Subjective:  ? ?By signing my name below, I, Chad Ward, attest that this documentation has been prepared under the direction and in the presence of Chad Alar, NP 06/25/2021  ?   ? ? Patient ID: Chad Ward, male    DOB: [birth year], 46 y.o.   MRN: [id number] ? ?Chief Complaint  ?Patient presents with  ? Leg Pain  ?  Patient complains of right calf pain for about one week ?  ? ? ?HPI ?Patient is in today for an office visit. ? ?He complains of pain in his right leg that started a week ago. Notes that it starts from his ankle and shoots up the calf. He adds he is able to initiate the pain by pressing down on his calf. He drove a long distance and was on his feet for a long time prior to the first episode. He is concerned about a possible clot. ? ?Past Medical History:  ?Diagnosis Date  ? Allergy   ? Diverticulitis   ? GERD (gastroesophageal reflux disease)   ? History of chicken pox   ? IBS (irritable bowel syndrome)   ? Meniere's disease   ? ? ?Past Surgical History:  ?Procedure Laterality Date  ? TONSILLECTOMY AND ADENOIDECTOMY  1988  ? VASECTOMY N/A 09/26/2014  ? Procedure: VASECTOMY;  Surgeon: Hollice Espy, MD;  Location: [facility] OR[facility];  Service: Urology;  Laterality: N/A;  ? WISDOM TOOTH EXTRACTION    ? ? ?Family History  ?Problem Relation Age of Onset  ? Hypertension Mother   ? Diverticulitis Mother   ? Colitis Mother   ? Irritable bowel syndrome Mother   ? Stroke Mother   ? Hypertension Father   ? Heart attack Father   ? Cancer Maternal Grandfather   ? Diverticulitis Maternal Uncle   ? Irritable bowel syndrome Maternal Aunt   ? Prostate cancer Neg Hx   ? Kidney cancer Neg Hx   ? Bladder Cancer Neg Hx   ? Hearing loss Neg Hx   ? Colon cancer Neg Hx   ? Esophageal cancer Neg Hx   ? Rectal cancer Neg Hx   ? Stomach cancer Neg Hx   ? ? ?Social History  ? ?Socioeconomic History  ? Marital status: Married  ?  Spouse name: Not on file  ? Number of children: 3  ? Years of education: Not on file  ? Highest  education level: Not on file  ?Occupational History  ? Not on file  ?Tobacco Use  ? Smoking status: Never  ? Smokeless tobacco: Never  ?Vaping Use  ? Vaping Use: Never used  ?Substance and Sexual Activity  ? Alcohol use: No  ? Drug use: No  ? Sexual activity: Yes  ?  Partners: Female  ?Other Topics Concern  ? Not on file  ?Social History Narrative  ? Works as Civil Service fast streamer at [employer]  ? 3 sons  ? Married  ? Completed bachelors  ? No pets  ? Enjoys movies  ? ?Social Determinants of Health  ? ?Financial Resource Strain: Not on file  ?Food Insecurity: Not on file  ?Transportation Needs: Not on file  ?Physical Activity: Not on file  ?Stress: Not on file  ?Social Connections: Not on file  ?Intimate Partner Violence: Not on file  ? ? ?Outpatient Medications Prior to Visit  ?Medication Sig Dispense Refill  ? atorvastatin (LIPITOR) 20 MG tablet Take 1 tablet (20 mg total) by mouth daily. 90 tablet 1  ? escitalopram (LEXAPRO) 10  MG tablet Take 1 tablet by mouth daily. Please make appointment for further refills. 90 tablet 1  ? loratadine-pseudoephedrine (CLARITIN-D 12 HOUR) 5-120 MG tablet Take 1 tablet by mouth 2 (two) times daily.    ? Probiotic Product (PROBIOTIC-10 PO) Take by mouth.    ? ?No facility-administered medications prior to visit.  ? ? ?No Known Allergies ? ?Review of Systems  ?Constitutional:  Negative for fever.  ?Respiratory:  Negative for shortness of breath and wheezing.   ?Cardiovascular:  Negative for chest pain.  ?Musculoskeletal:  Positive for myalgias (right leg). Negative for joint pain.  ? ?   ?Objective:  ?  ?Physical Exam ?Constitutional:   ?   General: He is not in acute distress. ?   Appearance: Normal appearance.  ?HENT:  ?   Head: Normocephalic and atraumatic.  ?Cardiovascular:  ?   Rate and Rhythm: Normal rate and regular rhythm.  ?   Heart sounds: No murmur heard. ?Pulmonary:  ?   Effort: No respiratory distress.  ?   Breath sounds: Normal breath sounds. No wheezing or  rales.  ?Musculoskeletal:  ?   Comments: No redness or swelling on right calf  ?Skin: ?   General: Skin is warm and dry.  ?Neurological:  ?   Mental Status: He is alert and oriented to person, place, and time.  ?Psychiatric:     ?   Behavior: Behavior normal.     ?   Thought Content: Thought content normal.  ? ? ?BP 137/81 (BP Location: Right Arm, Patient Position: Sitting, Cuff Size: Large)   Pulse 77   Temp 98.2 ?F (36.8 ?C) (Oral)   Resp 16   Wt 271 lb (122.9 kg)   SpO2 99%   BMI 41.21 kg/m?  ?Wt Readings from Last 3 Encounters:  ?06/25/21 271 lb (122.9 kg)  ?03/17/21 267 lb (121.1 kg)  ?06/24/20 251 lb (113.9 kg)  ? ? ?Diabetic Foot Exam - Simple   ?No data filed ?  ? ?Lab Results  ?Component Value Date  ? WBC 8.3 07/30/2017  ? HGB 15.4 07/30/2017  ? HCT 46.3 07/30/2017  ? PLT 274 07/30/2017  ? GLUCOSE 86 05/08/2020  ? CHOL 143 05/08/2020  ? TRIG 137.0 05/08/2020  ? HDL 35.50 (L) 05/08/2020  ? LDLDIRECT 98.0 06/06/2019  ? [place] 80 05/08/2020  ? ALT 35 05/08/2020  ? AST 22 05/08/2020  ? NA 141 05/08/2020  ? K 4.0 05/08/2020  ? CL 105 05/08/2020  ? CREATININE 1.11 05/08/2020  ? BUN 24 (H) 05/08/2020  ? CO2 29 05/08/2020  ? TSH 2.40 07/30/2017  ? ? ?Lab Results  ?Component Value Date  ? TSH 2.40 07/30/2017  ? ?Lab Results  ?Component Value Date  ? WBC 8.3 07/30/2017  ? HGB 15.4 07/30/2017  ? HCT 46.3 07/30/2017  ? MCV 76.9 (L) 07/30/2017  ? PLT 274 07/30/2017  ? ?Lab Results  ?Component Value Date  ? NA 141 05/08/2020  ? K 4.0 05/08/2020  ? CO2 29 05/08/2020  ? GLUCOSE 86 05/08/2020  ? BUN 24 (H) 05/08/2020  ? CREATININE 1.11 05/08/2020  ? BILITOT 0.9 05/08/2020  ? ALKPHOS 88 05/08/2020  ? AST 22 05/08/2020  ? ALT 35 05/08/2020  ? PROT 7.7 05/08/2020  ? ALBUMIN 4.8 05/08/2020  ? CALCIUM 9.5 05/08/2020  ? GFR 81.31 05/08/2020  ? ?Lab Results  ?Component Value Date  ? CHOL 143 05/08/2020  ? ?Lab Results  ?Component Value Date  ? HDL 35.50 (L) 05/08/2020  ? ?  Lab Results  ?Component Value Date  ? [place] 80  05/08/2020  ? ?Lab Results  ?Component Value Date  ? TRIG 137.0 05/08/2020  ? ?Lab Results  ?Component Value Date  ? CHOLHDL 4 05/08/2020  ? ?No results found for: HGBA1C ? ?   ?Assessment & Plan:  ? ?Problem List Items Addressed This Visit   ? ?  ? Unprioritized  ? Right calf pain - Primary  ?  New. Will obtain RLE doppler to rule out DVT.   ? ?  ?  ? Relevant Orders  ? US Venous Img Lower Unilateral Right  ? ? ? ?No orders of the defined types were placed in this encounter. ? ? ?I,Zite Okoli,acting as a Education administrator for [employer], NP.,have documented all relevant documentation on the behalf of Nance Pear, NP,as directed by  Nance Pear, NP while in the presence of Nance Pear, NP.  ? ?I, Chad Alar, NP, personally preformed the services described in this documentation.  All medical record entries made by the scribe were at my direction and in my presence.  I have reviewed the chart and discharge instructions (if applicable) and agree that the record reflects my personal performance and is accurate and complete. 06/25/2021 ?

## 2021-09-09 ENCOUNTER — Other Ambulatory Visit: Payer: Self-pay | Admitting: Family

## 2021-09-09 ENCOUNTER — Other Ambulatory Visit (HOSPITAL_COMMUNITY): Payer: Self-pay

## 2021-09-09 MED ORDER — ESCITALOPRAM OXALATE 10 MG PO TABS
10.0000 mg | ORAL_TABLET | Freq: Every day | ORAL | 0 refills | Status: DC
  Filled 2021-09-09: qty 90, 90d supply, fill #0

## 2021-12-22 ENCOUNTER — Other Ambulatory Visit: Payer: Self-pay | Admitting: Family

## 2021-12-24 ENCOUNTER — Other Ambulatory Visit (HOSPITAL_COMMUNITY): Payer: Self-pay

## 2021-12-24 MED ORDER — ESCITALOPRAM OXALATE 10 MG PO TABS
10.0000 mg | ORAL_TABLET | Freq: Every day | ORAL | 0 refills | Status: DC
  Filled 2021-12-24: qty 90, 90d supply, fill #0

## 2022-02-19 ENCOUNTER — Other Ambulatory Visit: Payer: Self-pay | Admitting: Family

## 2022-02-19 ENCOUNTER — Other Ambulatory Visit (HOSPITAL_BASED_OUTPATIENT_CLINIC_OR_DEPARTMENT_OTHER): Payer: Self-pay

## 2022-02-19 MED ORDER — ATORVASTATIN CALCIUM 20 MG PO TABS
20.0000 mg | ORAL_TABLET | Freq: Every day | ORAL | 0 refills | Status: DC
  Filled 2022-02-19 – 2022-02-20 (×2): qty 30, 30d supply, fill #0

## 2022-02-20 ENCOUNTER — Other Ambulatory Visit (HOSPITAL_BASED_OUTPATIENT_CLINIC_OR_DEPARTMENT_OTHER): Payer: Self-pay

## 2022-02-20 ENCOUNTER — Other Ambulatory Visit (HOSPITAL_COMMUNITY): Payer: Self-pay

## 2022-03-25 ENCOUNTER — Other Ambulatory Visit (HOSPITAL_COMMUNITY): Payer: Self-pay

## 2022-03-25 ENCOUNTER — Other Ambulatory Visit: Payer: Self-pay | Admitting: Family

## 2022-03-25 MED ORDER — ESCITALOPRAM OXALATE 10 MG PO TABS
10.0000 mg | ORAL_TABLET | Freq: Every day | ORAL | 0 refills | Status: DC
  Filled 2022-03-25: qty 90, 90d supply, fill #0

## 2022-05-06 IMAGING — US US EXTREM LOW VENOUS*R*
1 series · 13 of 24 positions shown · non-contrast
Comparison: None Available.

CLINICAL DATA: Right calf pain



[Series 1: us extrem low venous*right* · 13 of 42 slices shown]
[im 1/42]
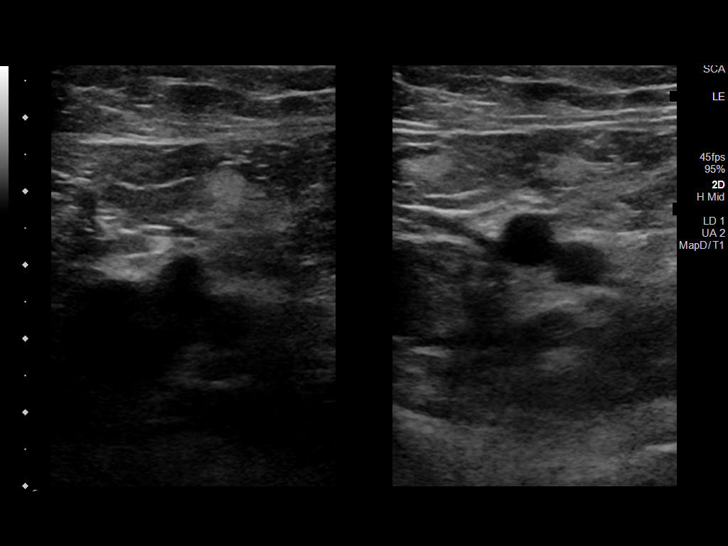
[im 4/42]
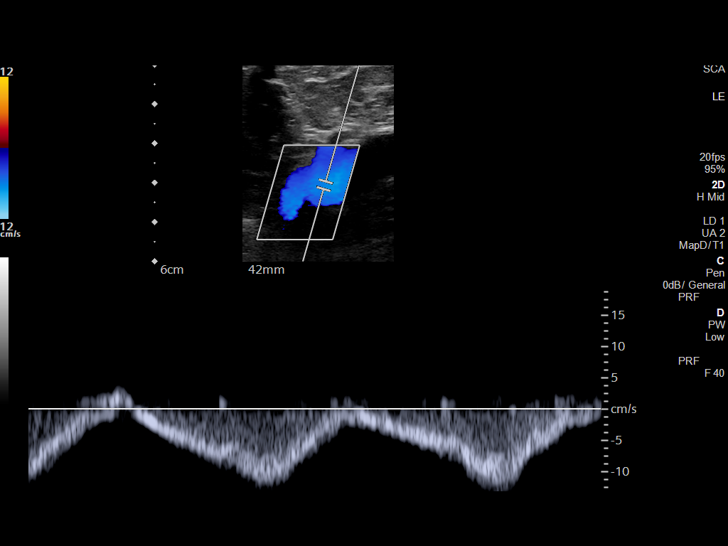
[im 8/42]
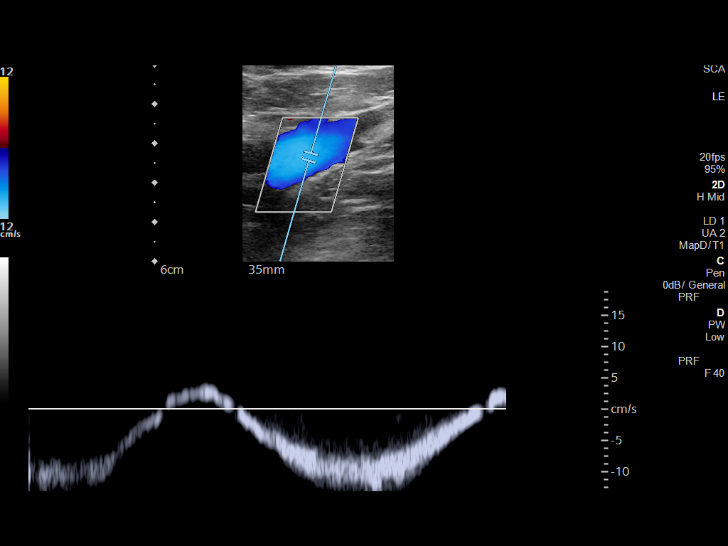
[im 11/42]
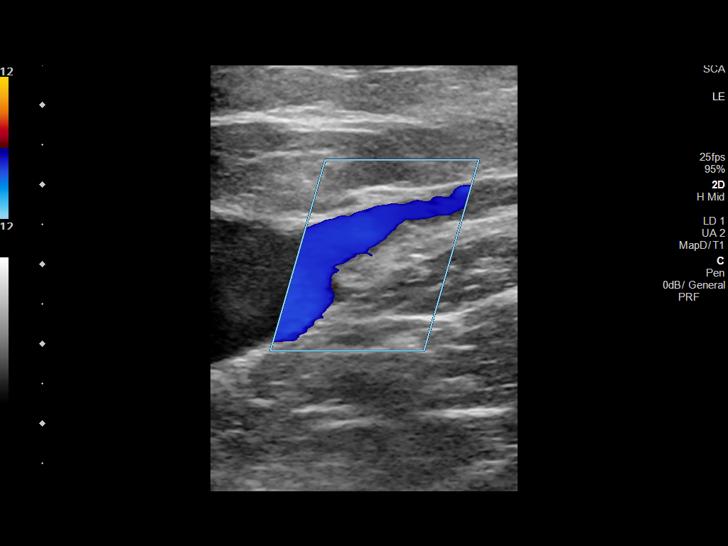
[im 15/42]
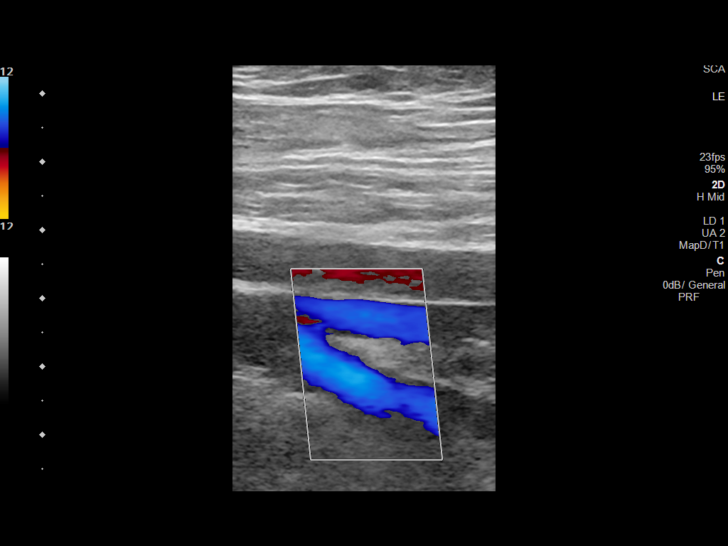
[im 18/42]
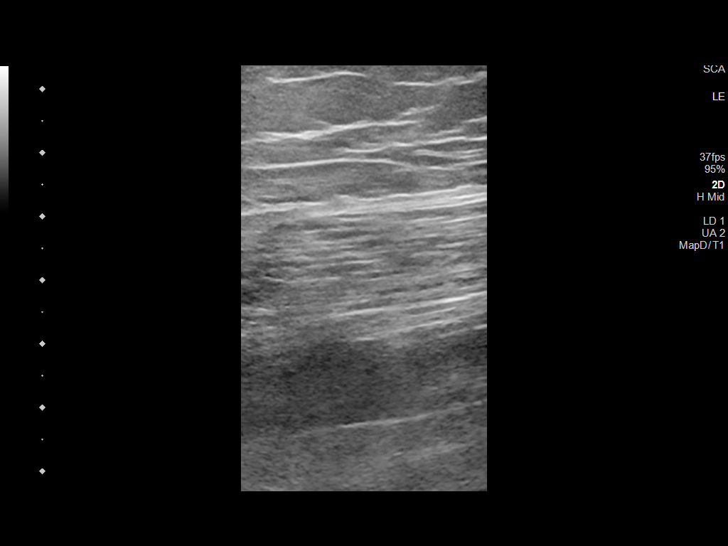
[im 22/42]
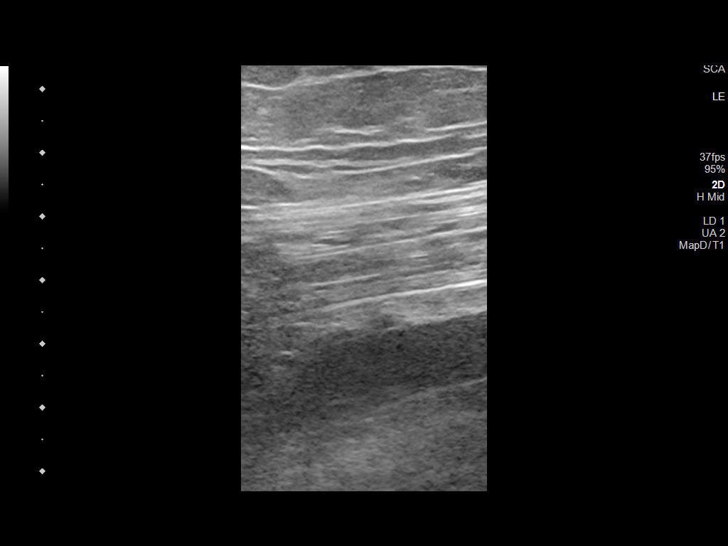
[im 24/42]
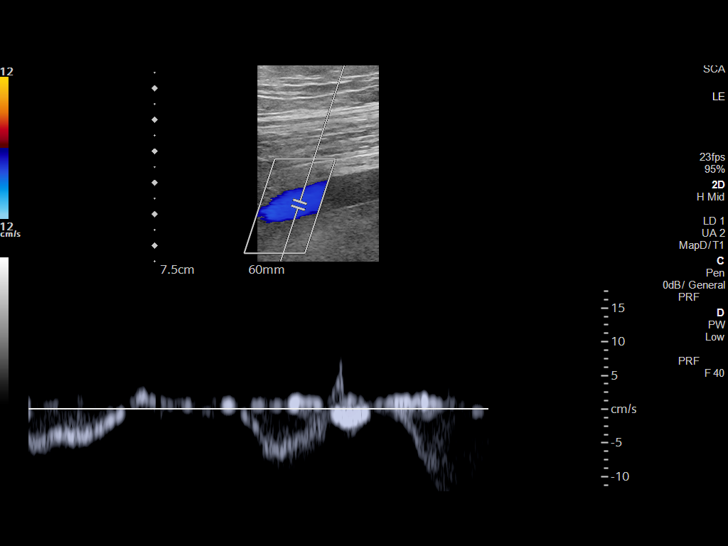
[im 27/42]
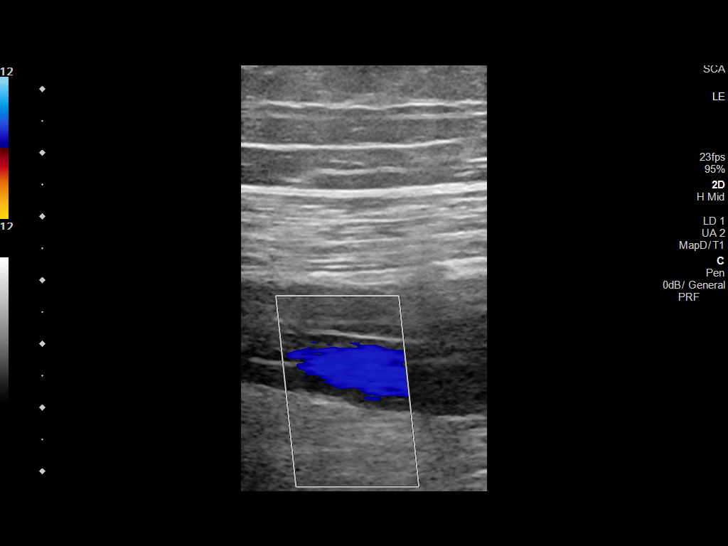
[im 31/42]
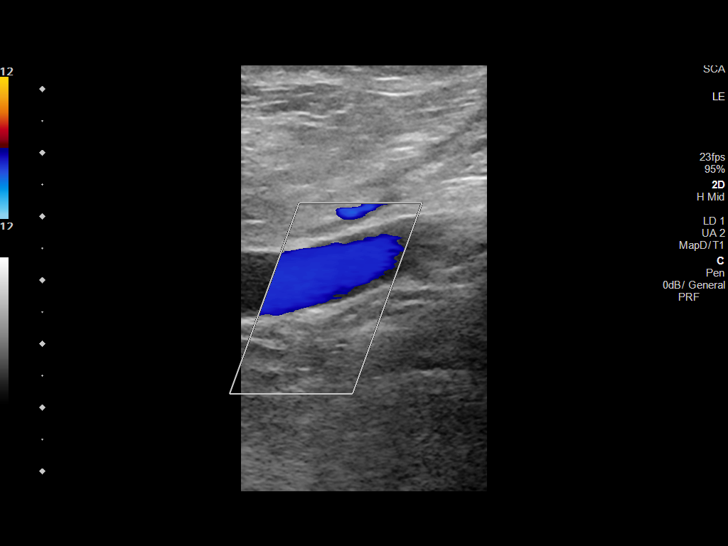
[im 34/42]
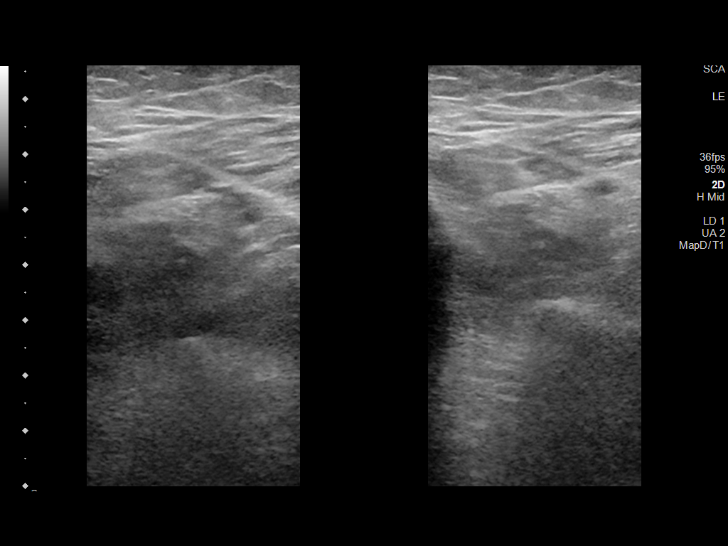
[im 38/42]
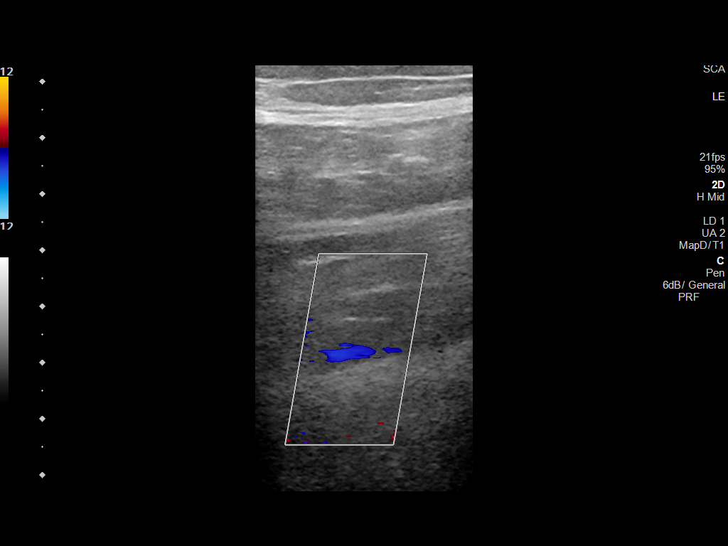
[im 42/42]
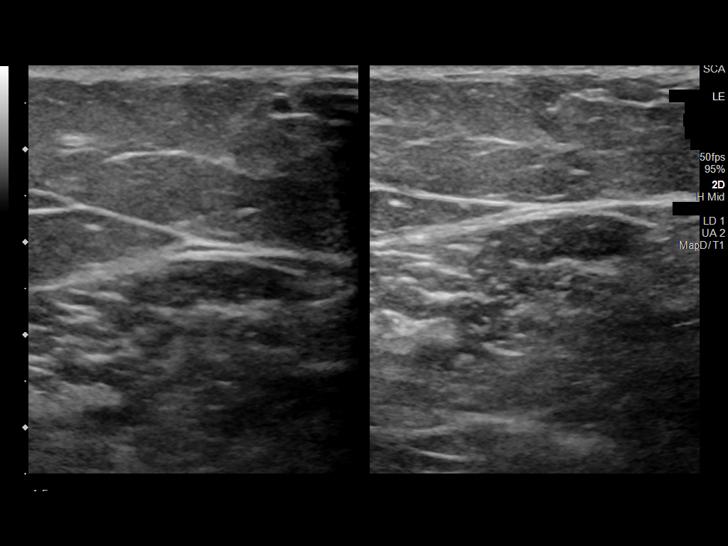

[13 of 24 positions shown; findings below may reference images not displayed]

FINDINGS: Contralateral Common Femoral Vein: Respiratory phasicity is normal
and symmetric with the symptomatic side. No evidence of thrombus.
Normal compressibility.

Common Femoral Vein: No evidence of thrombus. Normal
compressibility, respiratory phasicity and response to augmentation.

Saphenofemoral Junction: No evidence of thrombus. Normal
compressibility and flow on color Doppler imaging.

Profunda Femoral Vein: No evidence of thrombus. Normal
compressibility and flow on color Doppler imaging.

Femoral Vein: No evidence of thrombus. Normal compressibility,
respiratory phasicity and response to augmentation.

Popliteal Vein: No evidence of thrombus. Normal compressibility,
respiratory phasicity and response to augmentation.

Calf Veins: No evidence of thrombus. Normal compressibility and flow
on color Doppler imaging.

Superficial Great Saphenous Vein: No evidence of thrombus. Normal
compressibility.
IMPRESSION: No evidence of deep venous thrombosis.

## 2022-05-22 DIAGNOSIS — H1045 Other chronic allergic conjunctivitis: Secondary | ICD-10-CM | POA: Diagnosis not present

## 2022-05-22 DIAGNOSIS — H52223 Regular astigmatism, bilateral: Secondary | ICD-10-CM | POA: Diagnosis not present

## 2022-06-24 ENCOUNTER — Other Ambulatory Visit (HOSPITAL_COMMUNITY): Payer: Self-pay

## 2022-06-24 ENCOUNTER — Other Ambulatory Visit: Payer: Self-pay | Admitting: Family

## 2022-06-24 ENCOUNTER — Encounter: Payer: Self-pay | Admitting: *Deleted

## 2022-06-24 MED ORDER — ATORVASTATIN CALCIUM 20 MG PO TABS
20.0000 mg | ORAL_TABLET | Freq: Every day | ORAL | 0 refills | Status: DC
  Filled 2022-06-24: qty 30, 30d supply, fill #0

## 2022-06-24 MED ORDER — ESCITALOPRAM OXALATE 10 MG PO TABS
10.0000 mg | ORAL_TABLET | Freq: Every day | ORAL | 0 refills | Status: DC
  Filled 2022-06-24: qty 30, 30d supply, fill #0

## 2022-06-26 ENCOUNTER — Ambulatory Visit: Payer: Commercial Managed Care - PPO | Admitting: Family

## 2022-06-26 ENCOUNTER — Other Ambulatory Visit (HOSPITAL_BASED_OUTPATIENT_CLINIC_OR_DEPARTMENT_OTHER): Payer: Self-pay

## 2022-06-26 VITALS — BP 136/78 | HR 84 | Temp 98.2°F | Resp 16 | Wt 274.0 lb

## 2022-06-26 DIAGNOSIS — R109 Unspecified abdominal pain: Secondary | ICD-10-CM | POA: Diagnosis not present

## 2022-06-26 LAB — COMPREHENSIVE METABOLIC PANEL
ALT: 26 U/L (ref 0–53)
AST: 19 U/L (ref 0–37)
Albumin: 4.2 g/dL (ref 3.5–5.2)
Alkaline Phosphatase: 94 U/L (ref 39–117)
BUN: 19 mg/dL (ref 6–23)
CO2: 25 mEq/L (ref 19–32)
Calcium: 8.9 mg/dL (ref 8.4–10.5)
Chloride: 104 mEq/L (ref 96–112)
Creatinine, Ser: 0.94 mg/dL (ref 0.40–1.50)
GFR: 97.79 mL/min (ref >60.00)
Glucose, Bld: 90 mg/dL (ref 70–99)
Potassium: 4 mEq/L (ref 3.5–5.1)
Sodium: 139 mEq/L (ref 135–145)
Total Bilirubin: 0.9 mg/dL (ref 0.2–1.2)
Total Protein: 7 g/dL (ref 6.0–8.3)

## 2022-06-26 LAB — CBC WITH DIFFERENTIAL/PLATELET
Basophils Absolute: 0 10*3/uL (ref 0.0–0.1)
Basophils Relative: 0.5 % (ref 0.0–3.0)
Eosinophils Absolute: 0.2 10*3/uL (ref 0.0–0.7)
Eosinophils Relative: 1.7 % (ref 0.0–5.0)
HCT: 42.4 % (ref 39.0–52.0)
Hemoglobin: 14 g/dL (ref 13.0–17.0)
Lymphocytes Relative: 19 % (ref 12.0–46.0)
Lymphs Abs: 1.7 10*3/uL (ref 0.7–4.0)
MCHC: 33.1 g/dL (ref 30.0–36.0)
MCV: 79.3 fl (ref 78.0–100.0)
Monocytes Absolute: 0.7 10*3/uL (ref 0.1–1.0)
Monocytes Relative: 8.2 % (ref 3.0–12.0)
Neutro Abs: 6.4 10*3/uL (ref 1.4–7.7)
Neutrophils Relative %: 70.6 % (ref 43.0–77.0)
Platelets: 240 10*3/uL (ref 150.0–400.0)
RBC: 5.35 Mil/uL (ref 4.22–5.81)
RDW: 15.2 % (ref 11.5–15.5)
WBC: 9 10*3/uL (ref 4.0–10.5)

## 2022-06-26 MED ORDER — METRONIDAZOLE 500 MG PO TABS
500.0000 mg | ORAL_TABLET | Freq: Two times a day (BID) | ORAL | 0 refills | Status: AC
  Filled 2022-06-26: qty 14, 7d supply, fill #0

## 2022-06-26 MED ORDER — CIPROFLOXACIN HCL 500 MG PO TABS
500.0000 mg | ORAL_TABLET | Freq: Two times a day (BID) | ORAL | 0 refills | Status: AC
  Filled 2022-06-26: qty 14, 7d supply, fill #0

## 2022-06-26 NOTE — Assessment & Plan Note (Addendum)
Suspect diverticulitis. Will Rx with cipro/metronidazole. Reminded him not to drink alcohol on metronidazole.  Will also obtain CT abd/pelvis to rule out perforation/abscess. He is advised to go to the ER if develops fever >101 or worsening pain.   We had an opening for CT scan this afternoon at 3:30, however he has a commitment in [place] at that time and he scheduled for Sunday which is his first available time.

## 2022-06-26 NOTE — Progress Notes (Signed)
Subjective:     Patient ID: Chad Ward, male    DOB: [birth year], 47 y.o.   MRN: [id number]  Chief Complaint  Patient presents with   Abdominal Pain    Patient reports having a history of diverticulitis     Abdominal Pain   Patient is in today with concern of possible diverticulitis.  Reports he woke up with left sided abdominal pain on Tuesday, low grade fever which resolves with ibuprofen.  He is moving his bowels.  Took immodium Tuesday for is IBS-D.  No further diarrhea.  Denies blood in the stool. Had colonoscopy in 2019 which showed diverticulosis.   Health Maintenance Due  Topic Date Due   Hepatitis C Screening  Never done   COVID-19 Vaccine (4 - 2023-24 season) 10/24/2021    Past Medical History:  Diagnosis Date   Allergy    Diverticulitis    GERD (gastroesophageal reflux disease)    History of chicken pox    IBS (irritable bowel syndrome)    Meniere's disease     Past Surgical History:  Procedure Laterality Date   TONSILLECTOMY AND ADENOIDECTOMY  1988   VASECTOMY N/A 09/26/2014   Procedure: VASECTOMY;  Surgeon: Vanna Scotland, MD;  Location: [facility] OR[facility];  Service: Urology;  Laterality: N/A;   WISDOM TOOTH EXTRACTION      Family History  Problem Relation Age of Onset   Hypertension Mother    Diverticulitis Mother    Colitis Mother    Irritable bowel syndrome Mother    Stroke Mother    Hypertension Father    Heart attack Father    Cancer Maternal Grandfather    Diverticulitis Maternal Uncle    Irritable bowel syndrome Maternal Aunt    Prostate cancer Neg Hx    Kidney cancer Neg Hx    Bladder Cancer Neg Hx    Hearing loss Neg Hx    Colon cancer Neg Hx    Esophageal cancer Neg Hx    Rectal cancer Neg Hx    Stomach cancer Neg Hx     Social History   Socioeconomic History   Marital status: Married    Spouse name: Not on file   Number of children: 3   Years of education: Not on file   Highest education level: Master's degree (e.g., MA, MS, MEng,  MEd, MSW, MBA)  Occupational History   Not on file  Tobacco Use   Smoking status: Never   Smokeless tobacco: Never  Vaping Use   Vaping Use: Never used  Substance and Sexual Activity   Alcohol use: No   Drug use: No   Sexual activity: Yes    Partners: Female  Other Topics Concern   Not on file  Social History Narrative   Works as Interior and spatial designer of recruiting/admissions at [employer]   3 sons   Married   Completed bachelors   No pets   Enjoys movies   Social Determinants of Corporate investment banker Strain: Low Risk  (06/25/2022)   Overall Financial Resource Strain (CARDIA)    Difficulty of Paying Living Expenses: Not hard at all  Food Insecurity: No Food Insecurity (06/25/2022)   Hunger Vital Sign    Worried About Running Out of Food in the Last Year: Never true    Ran Out of Food in the Last Year: Never true  Transportation Needs: No Transportation Needs (06/25/2022)   PRAPARE - Administrator, Civil Service (Medical): No    Lack of Transportation (Non-Medical):  No  Physical Activity: Insufficiently Active (06/25/2022)   Exercise Vital Sign    Days of Exercise per Week: 2 days    Minutes of Exercise per Session: 10 min  Stress: Stress Concern Present (06/25/2022)   [employer] of Occupational Health - Occupational Stress Questionnaire    Feeling of Stress : To some extent  Social Connections: Socially Integrated (06/25/2022)   Social Connection and Isolation Panel [NHANES]    Frequency of Communication with Friends and Family: More than three times a week    Frequency of Social Gatherings with Friends and Family: Twice a week    Attends Religious Services: More than 4 times per year    Active Member of [employer] or Organizations: Yes    Attends Banker Meetings: 1 to 4 times per year    Marital Status: Married  Catering manager Violence: Not on file    Outpatient Medications Prior to Visit  Medication Sig Dispense Refill   atorvastatin (LIPITOR) 20 MG  tablet Take 1 tablet (20 mg total) by mouth daily. 30 tablet 0   escitalopram (LEXAPRO) 10 MG tablet Take 1 tablet (10 mg) by mouth daily. Due for CPE 30 tablet 0   loratadine-pseudoephedrine (CLARITIN-D 12 HOUR) 5-120 MG tablet Take 1 tablet by mouth 2 (two) times daily.     Probiotic Product (PROBIOTIC-10 PO) Take by mouth.     No facility-administered medications prior to visit.    No Known Allergies  Review of Systems  Gastrointestinal:  Positive for abdominal pain.       Objective:    Physical Exam Constitutional:      General: He is not in acute distress.    Appearance: He is well-developed.  HENT:     Head: Normocephalic and atraumatic.  Cardiovascular:     Rate and Rhythm: Normal rate and regular rhythm.     Heart sounds: No murmur heard. Pulmonary:     Effort: Pulmonary effort is normal. No respiratory distress.     Breath sounds: Normal breath sounds. No wheezing or rales.  Abdominal:     General: Bowel sounds are decreased.     Palpations: Abdomen is soft.     Tenderness: There is abdominal tenderness in the left upper quadrant and left lower quadrant. There is no rebound.  Skin:    General: Skin is warm and dry.  Neurological:     Mental Status: He is alert and oriented to person, place, and time.  Psychiatric:        Behavior: Behavior normal.        Thought Content: Thought content normal.     BP 136/78 (BP Location: Right Arm, Patient Position: Sitting, Cuff Size: Large)   Pulse 84   Temp 98.2 F (36.8 C) (Oral)   Resp 16   Wt 274 lb (124.3 kg)   SpO2 98%   BMI 41.66 kg/m  Wt Readings from Last 3 Encounters:  06/26/22 274 lb (124.3 kg)  06/25/21 271 lb (122.9 kg)  03/17/21 267 lb (121.1 kg)       Assessment & Plan:   Problem List Items Addressed This Visit       Unprioritized   Abdominal pain - Primary    Suspect diverticulitis. Will Rx with cipro/metronidazole. Reminded him not to drink alcohol on metronidazole.  Will also obtain CT  abd/pelvis to rule out perforation/abscess. He is advised to go to the ER if develops fever >101 or worsening pain.   We had an opening for  CT scan this afternoon at 3:30, however he has a commitment in [place] at that time and he scheduled for Sunday which is his first available time.       Relevant Medications   metroNIDAZOLE (FLAGYL) 500 MG tablet   ciprofloxacin (CIPRO) 500 MG tablet   Other Relevant Orders   CT Abdomen Pelvis W Contrast   CBC w/Diff   Comp Met (CMET)    I have discontinued Fayrene Fearing A. Stopka "Adam"'s Probiotic Product (PROBIOTIC-10 PO). I am also having him start on metroNIDAZOLE and ciprofloxacin. Additionally, I am having him maintain his Claritin-D 12 Hour, atorvastatin, and escitalopram.  Meds ordered this encounter  Medications   metroNIDAZOLE (FLAGYL) 500 MG tablet    Sig: Take 1 tablet (500 mg total) by mouth 2 (two) times daily for 7 days.    Dispense:  14 tablet    Refill:  0    Order Specific Question:   Supervising Provider    Answer:   Danise Edge A [[id number]]   ciprofloxacin (CIPRO) 500 MG tablet    Sig: Take 1 tablet (500 mg total) by mouth 2 (two) times daily for 7 days.    Dispense:  14 tablet    Refill:  0    Order Specific Question:   Supervising Provider    Answer:   Danise Edge A [[id number]]

## 2022-06-26 NOTE — Assessment & Plan Note (Deleted)
Suspect diverticulitis. Will Rx with cipro/metronidazole. Reminded him not to drink alcohol on metronidazole.  Will also obtain CT abd/pelvis to rule out perforation/abscess. He is advised to go to the ER if develops fever >101 or worsening pain.

## 2022-06-28 ENCOUNTER — Ambulatory Visit (HOSPITAL_BASED_OUTPATIENT_CLINIC_OR_DEPARTMENT_OTHER): Payer: Commercial Managed Care - PPO

## 2022-07-28 ENCOUNTER — Other Ambulatory Visit (HOSPITAL_COMMUNITY): Payer: Self-pay

## 2022-07-28 ENCOUNTER — Other Ambulatory Visit: Payer: Self-pay | Admitting: Family

## 2022-07-28 ENCOUNTER — Encounter: Payer: Self-pay | Admitting: *Deleted

## 2022-07-28 MED ORDER — ESCITALOPRAM OXALATE 10 MG PO TABS
10.0000 mg | ORAL_TABLET | Freq: Every day | ORAL | 0 refills | Status: DC
  Filled 2022-07-28: qty 30, 30d supply, fill #0

## 2022-07-28 MED ORDER — ATORVASTATIN CALCIUM 20 MG PO TABS
20.0000 mg | ORAL_TABLET | Freq: Every day | ORAL | 0 refills | Status: DC
  Filled 2022-07-28: qty 30, 30d supply, fill #0

## 2022-08-31 ENCOUNTER — Other Ambulatory Visit: Payer: Self-pay | Admitting: Family

## 2022-08-31 ENCOUNTER — Other Ambulatory Visit (HOSPITAL_COMMUNITY): Payer: Self-pay

## 2022-08-31 MED ORDER — ATORVASTATIN CALCIUM 20 MG PO TABS
20.0000 mg | ORAL_TABLET | Freq: Every day | ORAL | 0 refills | Status: DC
  Filled 2022-08-31: qty 90, 90d supply, fill #0

## 2022-08-31 MED ORDER — ESCITALOPRAM OXALATE 10 MG PO TABS
10.0000 mg | ORAL_TABLET | Freq: Every day | ORAL | 0 refills | Status: DC
  Filled 2022-08-31: qty 90, 90d supply, fill #0

## 2022-09-01 ENCOUNTER — Ambulatory Visit: Payer: Commercial Managed Care - PPO | Admitting: Family

## 2022-09-01 VITALS — BP 136/79 | HR 76 | Temp 98.2°F | Resp 18 | Ht 68.0 in | Wt 273.4 lb

## 2022-09-01 DIAGNOSIS — Z1159 Encounter for screening for other viral diseases: Secondary | ICD-10-CM

## 2022-09-01 DIAGNOSIS — E785 Hyperlipidemia, unspecified: Secondary | ICD-10-CM | POA: Diagnosis not present

## 2022-09-01 DIAGNOSIS — Z0000 Encounter for general adult medical examination without abnormal findings: Secondary | ICD-10-CM

## 2022-09-01 DIAGNOSIS — F419 Anxiety disorder, unspecified: Secondary | ICD-10-CM | POA: Diagnosis not present

## 2022-09-01 DIAGNOSIS — K5792 Diverticulitis of intestine, part unspecified, without perforation or abscess without bleeding: Secondary | ICD-10-CM | POA: Diagnosis not present

## 2022-09-01 NOTE — Assessment & Plan Note (Signed)
  General Health Maintenance: -Up to date on colonoscopy and tetanus immunization. -Consider flu shot and COVID booster in the fall at the pharmacy. -Order Hepatitis C screening as it is recommended once for all adults. -Encourage regular exercise and healthy diet. -Advise to reschedule missed dental appointment. -Follow up in a year if mood remains stable.

## 2022-09-01 NOTE — Assessment & Plan Note (Signed)
Lab Results  Component Value Date   CHOL 143 05/08/2020   HDL 35.50 (L) 05/08/2020   LDLCALC 80 05/08/2020   LDLDIRECT 98.0 06/06/2019   TRIG 137.0 05/08/2020   CHOLHDL 4 05/08/2020   Last LDL at goal on atorvastatin 20mg .

## 2022-09-01 NOTE — Patient Instructions (Signed)
VISIT SUMMARY:  During your recent visit, we discussed your overall health and ongoing conditions. We reviewed your history of diverticulitis, which is now resolved, and your high cholesterol, which is being managed with Lipitor. We also discussed your use of Lexapro for mood management. We talked about your general health maintenance, including your diet, exercise, and immunizations.  YOUR PLAN:  -DIVERTICULITIS: Diverticulitis is a condition where small pouches in your digestive tract become inflamed or infected. You've successfully treated this condition and are now maintaining a high fiber diet to prevent recurrence. Please continue with your high fiber diet and hydration.  -HIGH CHOLESTEROL: High cholesterol can increase your risk of heart disease. You're currently taking Lipitor to manage this condition. We'll order a lipid panel to assess your current cholesterol levels.  -DEPRESSION: Depression is a mood disorder that causes persistent feelings of sadness and loss of interest. You're stable on Lexapro, which helps manage your symptoms. Continue taking Lexapro as prescribed.  -GENERAL HEALTH MAINTENANCE: This includes routine screenings, immunizations, and lifestyle recommendations to maintain your overall health. You're up to date on your colonoscopy and tetanus immunization. Consider getting a flu shot and COVID booster in the fall at the pharmacy. We'll also order a Hepatitis C screening, as it's recommended for all adults. Try to incorporate regular exercise and a healthy diet into your routine. Also, remember to reschedule your missed dental appointment.  INSTRUCTIONS:  Please follow the plan we discussed during your visit. Continue with your high fiber diet and hydration for diverticulitis prevention. Take your Lipitor as prescribed and we'll check your cholesterol levels soon. Keep taking your Lexapro for mood management. Consider getting your flu shot and COVID booster in the fall, and  we'll arrange for a Hepatitis C screening. Try to exercise regularly, maintain a healthy diet, and reschedule your missed dental appointment. We'll follow up in a year if your mood remains stable.

## 2022-09-01 NOTE — Progress Notes (Signed)
Subjective:     Patient ID: Chad Ward, male    DOB: 1976/02/19, 47 y.o.   MRN: [id number]  Chief Complaint  Patient presents with   Follow-up    Concerns/ questions: none Hep C screen due     HPI  Discussed the use of AI scribe software for clinical note transcription with the patient, who gave verbal consent to proceed.  History of Present Illness   The patient presents for a routine physical. He has a history of diverticulitis, which was treated and resolved since his last visit.  The patient was supposed to have a scan for his previous diverticulitis but cancelled it due to the out-of-pocket cost and improvement in his symptoms. He maintains a high fiber diet, including daily apple consumption. He had a colonoscopy in 2019 with a recommended 10-year follow-up. His tetanus immunization is up-to-date, and he plans to receive the flu shot and COVID booster in the fall at the pharmacy.  The patient admits to a less than ideal diet due to a busy schedule with baseball games and practices for his children. He gets minimal exercise, mainly from attending baseball games and walking his dog. He has a history of high cholesterol, which was last checked two years ago and was improved from five years ago due to Lipitor.  The patient is on Lexapro for mood management and reports feeling "swimmy headed" if he misses a dose for more than a day and a half. Overall mood has been stable on the 10mg  dose of lexapro. He recently experienced a fever that lasted for a couple of days but tested negative for COVID-19 and symptoms resolved on their own.  He has Meniere's disease, which is stable, and IBS, which is his normal. He also has hereditary skin tags, which he finds annoying and may wish to have removed at some point.     Patient presents today for complete physical.  Immunizations: up to date Diet: on the go Wt Readings from Last 3 Encounters:  09/01/22 273 lb 6.4 oz (124 kg)  06/26/22 274  lb (124.3 kg)  06/25/21 271 lb (122.9 kg)  Exercise: no formal exercise Colonoscopy:  2019 Vision: up to date Dental: due    Health Maintenance Due  Topic Date Due   Hepatitis C Screening  Never done   COVID-19 Vaccine (4 - 2023-24 season) 10/24/2021    Past Medical History:  Diagnosis Date   Allergy    Diverticulitis    GERD (gastroesophageal reflux disease)    History of chicken pox    IBS (irritable bowel syndrome)    Meniere's disease     Past Surgical History:  Procedure Laterality Date   TONSILLECTOMY AND ADENOIDECTOMY  1988   VASECTOMY N/A 09/26/2014   Procedure: VASECTOMY;  Surgeon: Vanna Scotland, MD;  Location: [facility];  Service: Urology;  Laterality: N/A;   WISDOM TOOTH EXTRACTION      Family History  Problem Relation Age of Onset   Hypertension Mother    Diverticulitis Mother    Colitis Mother    Irritable bowel syndrome Mother    Stroke Mother    Hypertension Father    Heart attack Father    Cancer Maternal Grandfather    Diverticulitis Maternal Uncle    Irritable bowel syndrome Maternal Aunt    Prostate cancer Neg Hx    Kidney cancer Neg Hx    Bladder Cancer Neg Hx    Hearing loss Neg Hx    Colon cancer  Neg Hx    Esophageal cancer Neg Hx    Rectal cancer Neg Hx    Stomach cancer Neg Hx     Social History   Socioeconomic History   Marital status: Married    Spouse name: Not on file   Number of children: 3   Years of education: Not on file   Highest education level: Master's degree (e.g., MA, MS, MEng, MEd, MSW, MBA)  Occupational History   Not on file  Tobacco Use   Smoking status: Never   Smokeless tobacco: Never  Vaping Use   Vaping Use: Never used  Substance and Sexual Activity   Alcohol use: No   Drug use: No   Sexual activity: Yes    Partners: Female  Other Topics Concern   Not on file  Social History Narrative   Works as Interior and spatial designer of recruiting/admissions at [employer]   3 sons   Married   Completed bachelors   No pets    Enjoys movies   Social Determinants of Corporate investment banker Strain: Low Risk  (06/25/2022)   Overall Financial Resource Strain (CARDIA)    Difficulty of Paying Living Expenses: Not hard at all  Food Insecurity: No Food Insecurity (06/25/2022)   Hunger Vital Sign    Worried About Running Out of Food in the Last Year: Never true    Ran Out of Food in the Last Year: Never true  Transportation Needs: No Transportation Needs (06/25/2022)   PRAPARE - Administrator, Civil Service (Medical): No    Lack of Transportation (Non-Medical): No  Physical Activity: Insufficiently Active (06/25/2022)   Exercise Vital Sign    Days of Exercise per Week: 2 days    Minutes of Exercise per Session: 10 min  Stress: Stress Concern Present (06/25/2022)   [employer] of Occupational Health - Occupational Stress Questionnaire    Feeling of Stress : To some extent  Social Connections: Socially Integrated (06/25/2022)   Social Connection and Isolation Panel [NHANES]    Frequency of Communication with Friends and Family: More than three times a week    Frequency of Social Gatherings with Friends and Family: Twice a week    Attends Religious Services: More than 4 times per year    Active Member of [employer] or Organizations: Yes    Attends Banker Meetings: 1 to 4 times per year    Marital Status: Married  Catering manager Violence: Not on file    Outpatient Medications Prior to Visit  Medication Sig Dispense Refill   atorvastatin (LIPITOR) 20 MG tablet Take 1 tablet (20 mg) by mouth daily. 90 tablet 0   escitalopram (LEXAPRO) 10 MG tablet Take 1 tablet (10 mg) by mouth daily. 90 tablet 0   loratadine-pseudoephedrine (CLARITIN-D 12 HOUR) 5-120 MG tablet Take 1 tablet by mouth 2 (two) times daily.     No facility-administered medications prior to visit.    No Known Allergies  Review of Systems  Constitutional:  Negative for weight loss.  HENT:  Negative for congestion and hearing  loss.   Eyes:  Negative for blurred vision.  Respiratory:  Negative for cough.   Cardiovascular:  Negative for chest pain.  Gastrointestinal:  Negative for constipation and diarrhea.  Genitourinary:  Negative for frequency and urgency.  Musculoskeletal:  Negative for joint pain and myalgias.  Skin:  Negative for rash.  Neurological:  Negative for headaches.  Psychiatric/Behavioral:         Mood is stable  Objective:    Physical Exam   BP 136/79 (BP Location: Left Arm, Patient Position: Sitting, Cuff Size: Large)   Pulse 76   Temp 98.2 F (36.8 C) (Oral)   Resp 18   Ht 5\' 8"  (1.727 m)   Wt 273 lb 6.4 oz (124 kg)   SpO2 98%   BMI 41.57 kg/m  Wt Readings from Last 3 Encounters:  09/01/22 273 lb 6.4 oz (124 kg)  06/26/22 274 lb (124.3 kg)  06/25/21 271 lb (122.9 kg)  Physical Exam  Constitutional: He is oriented to person, place, and time. He appears well-developed and well-nourished. No distress.  HENT:  Head: Normocephalic and atraumatic.  Right Ear: Tympanic membrane and ear canal normal.  Left Ear: Tympanic membrane and ear canal normal.  Mouth/Throat: Oropharynx is clear and moist.  Eyes: Pupils are equal, round, and reactive to light. No scleral icterus.  Neck: Normal range of motion. No thyromegaly present.  Cardiovascular: Normal rate and regular rhythm.   No murmur heard. Pulmonary/Chest: Effort normal and breath sounds normal. No respiratory distress. He has no wheezes. He has no rales. He exhibits no tenderness.  Abdominal: Soft. Bowel sounds are normal. He exhibits no distension and no mass. There is no tenderness. There is no rebound and no guarding.  Musculoskeletal: He exhibits no edema.  Lymphadenopathy:    He has no cervical adenopathy.  Neurological: He is alert and oriented to person, place, and time. He has normal patellar reflexes. He exhibits normal muscle tone. Coordination normal.  Skin: Skin is warm and dry.  Psychiatric: He has a normal  mood and affect. His behavior is normal. Judgment and thought content normal.           Assessment & Plan:        Assessment & Plan:   Problem List Items Addressed This Visit       Unprioritized   Preventative health care - Primary     General Health Maintenance: -Up to date on colonoscopy and tetanus immunization. -Consider flu shot and COVID booster in the fall at the pharmacy. -Order Hepatitis C screening as it is recommended once for all adults. -Encourage regular exercise and healthy diet. -Advise to reschedule missed dental appointment. -Follow up in a year if mood remains stable.      Dyslipidemia    Lab Results  Component Value Date   CHOL 143 05/08/2020   HDL 35.50 (L) 05/08/2020   LDLCALC 80 05/08/2020   LDLDIRECT 98.0 06/06/2019   TRIG 137.0 05/08/2020   CHOLHDL 4 05/08/2020  Last LDL at goal on atorvastatin 20mg .       Relevant Orders   Lipid panel   Diverticulitis     Resolved with treatment. Maintaining high fiber diet for prevention. -Continue high fiber diet and hydration.      Anxiety    Stable on lexapro. Continue same.       Other Visit Diagnoses     Encounter for hepatitis C screening test for low risk patient       Relevant Orders   Hepatitis C Antibody       I am having Callaghan A. Pricilla Holm "Adam" maintain his Claritin-D 12 Hour, atorvastatin, and escitalopram.  No orders of the defined types were placed in this encounter.

## 2022-09-01 NOTE — Assessment & Plan Note (Signed)
Resolved with treatment. Maintaining high fiber diet for prevention. -Continue high fiber diet and hydration.

## 2022-09-01 NOTE — Assessment & Plan Note (Signed)
Stable on lexapro.  Continue same.   

## 2022-09-02 LAB — LIPID PANEL
Cholesterol: 203 mg/dL — ABNORMAL HIGH (ref 0–200)
HDL: 33.3 mg/dL — ABNORMAL LOW (ref >39.00)
NonHDL: 169.95
Total CHOL/HDL Ratio: 6
Triglycerides: 331 mg/dL — ABNORMAL HIGH (ref 0.0–149.0)
VLDL: 66.2 mg/dL — ABNORMAL HIGH (ref 0.0–40.0)

## 2022-09-02 LAB — HEPATITIS C ANTIBODY: Hepatitis C Ab: NONREACTIVE

## 2022-09-02 LAB — LDL CHOLESTEROL, DIRECT: Direct LDL: 131 mg/dL

## 2022-11-25 ENCOUNTER — Other Ambulatory Visit: Payer: Self-pay | Admitting: Family

## 2022-11-26 ENCOUNTER — Other Ambulatory Visit (HOSPITAL_BASED_OUTPATIENT_CLINIC_OR_DEPARTMENT_OTHER): Payer: Self-pay

## 2022-11-26 MED ORDER — ESCITALOPRAM OXALATE 10 MG PO TABS
10.0000 mg | ORAL_TABLET | Freq: Every day | ORAL | 2 refills | Status: DC
  Filled 2022-11-26: qty 90, 90d supply, fill #0
  Filled 2023-03-01: qty 90, 90d supply, fill #1
  Filled 2023-05-28: qty 90, 90d supply, fill #2

## 2022-11-26 MED ORDER — ATORVASTATIN CALCIUM 20 MG PO TABS
20.0000 mg | ORAL_TABLET | Freq: Every day | ORAL | 2 refills | Status: DC
  Filled 2022-11-26: qty 90, 90d supply, fill #0
  Filled 2023-02-15: qty 90, 90d supply, fill #1
  Filled 2023-05-28: qty 90, 90d supply, fill #2

## 2023-02-15 ENCOUNTER — Other Ambulatory Visit: Payer: Self-pay

## 2023-08-29 ENCOUNTER — Other Ambulatory Visit: Payer: Self-pay | Admitting: Family

## 2023-08-29 DIAGNOSIS — E785 Hyperlipidemia, unspecified: Secondary | ICD-10-CM

## 2023-08-29 DIAGNOSIS — F419 Anxiety disorder, unspecified: Secondary | ICD-10-CM

## 2023-08-30 ENCOUNTER — Other Ambulatory Visit (HOSPITAL_BASED_OUTPATIENT_CLINIC_OR_DEPARTMENT_OTHER): Payer: Self-pay

## 2023-08-30 MED ORDER — ESCITALOPRAM OXALATE 10 MG PO TABS
10.0000 mg | ORAL_TABLET | Freq: Every day | ORAL | 0 refills | Status: DC
Start: 2023-08-30 — End: 2023-09-29
  Filled 2023-08-30: qty 30, 30d supply, fill #0

## 2023-08-30 MED ORDER — ATORVASTATIN CALCIUM 20 MG PO TABS
20.0000 mg | ORAL_TABLET | Freq: Every day | ORAL | 0 refills | Status: DC
Start: 2023-08-30 — End: 2023-09-29
  Filled 2023-08-30: qty 30, 30d supply, fill #0

## 2023-08-30 NOTE — Telephone Encounter (Signed)
Please contact pt to schedule follow up visit.

## 2023-09-29 ENCOUNTER — Ambulatory Visit: Admitting: Family

## 2023-09-29 ENCOUNTER — Other Ambulatory Visit (HOSPITAL_BASED_OUTPATIENT_CLINIC_OR_DEPARTMENT_OTHER): Payer: Self-pay

## 2023-09-29 VITALS — BP 125/80 | HR 75 | Temp 98.7°F | Resp 16 | Ht 68.0 in | Wt 266.0 lb

## 2023-09-29 DIAGNOSIS — F32A Depression, unspecified: Secondary | ICD-10-CM | POA: Diagnosis not present

## 2023-09-29 DIAGNOSIS — F419 Anxiety disorder, unspecified: Secondary | ICD-10-CM

## 2023-09-29 DIAGNOSIS — F429 Obsessive-compulsive disorder, unspecified: Secondary | ICD-10-CM

## 2023-09-29 DIAGNOSIS — E785 Hyperlipidemia, unspecified: Secondary | ICD-10-CM

## 2023-09-29 MED ORDER — ATORVASTATIN CALCIUM 20 MG PO TABS
20.0000 mg | ORAL_TABLET | Freq: Every day | ORAL | 1 refills | Status: AC
  Filled 2023-09-29: qty 30, 30d supply, fill #0
  Filled 2023-11-29: qty 30, 30d supply, fill #1
  Filled 2024-01-26: qty 30, 30d supply, fill #2
  Filled 2024-02-22 – 2024-03-01 (×2): qty 30, 30d supply, fill #3

## 2023-09-29 MED ORDER — ESCITALOPRAM OXALATE 10 MG PO TABS
10.0000 mg | ORAL_TABLET | Freq: Every day | ORAL | 1 refills | Status: DC
  Filled 2023-09-29: qty 30, 30d supply, fill #0
  Filled 2023-10-28: qty 30, 30d supply, fill #1
  Filled 2023-11-29: qty 30, 30d supply, fill #2
  Filled 2023-12-28: qty 30, 30d supply, fill #3
  Filled 2024-01-26: qty 30, 30d supply, fill #4
  Filled 2024-02-22 – 2024-03-01 (×2): qty 30, 30d supply, fill #5

## 2023-09-29 NOTE — Assessment & Plan Note (Signed)
 Reports that this is well controlled.

## 2023-09-29 NOTE — Assessment & Plan Note (Addendum)
 Lab Results  Component Value Date   CHOL 203 (H) 09/01/2022   HDL 33.30 (L) 09/01/2022   LDLCALC 80 05/08/2020   LDLDIRECT 131.0 09/01/2022   TRIG 331.0 (H) 09/01/2022   CHOLHDL 6 09/01/2022   Continues lipitor. LDL good but trigs were elevated. Reinforced dietary changes: work on avoiding concentrated sweets, and limiting white carbs (rice/bread/pasta/potatoes).  Instead substitute whole grain versions with reasonable portions. Will return for FLP.

## 2023-09-29 NOTE — Patient Instructions (Signed)
 VISIT SUMMARY:  You had a follow-up visit to manage your anxiety, hyperlipidemia, and general health. We discussed your current medications, lifestyle modifications, and future lab work.  YOUR PLAN:  ADULT WELLNESS VISIT: Routine follow-up for health maintenance and lifestyle modifications. -Schedule fasting lab work for cholesterol and triglycerides. -Continue intermittent fasting. -Keep playing tennis for physical activity.  HYPERLIPIDEMIA: Your cholesterol level is 203 mg/dL with elevated triglycerides. -Order fasting lipid panel. -Continue taking Lipitor.  ANXIETY DISORDER AND OBSESSIVE-COMPULSIVE DISORDER: Your anxiety and OCD symptoms are manageable with Lexapro . -Continue taking Lexapro . -Refill Lexapro  prescription.  GENERAL HEALTH MAINTENANCE: We discussed the hepatitis B vaccine series and its benefits. -Consider getting the hepatitis B vaccine series.

## 2023-09-29 NOTE — Progress Notes (Signed)
 Subjective:     Patient ID: Chad Ward, male    DOB: 09/14/1975, 48 y.o.   MRN: [id number]  Chief Complaint  Patient presents with   Anxiety    Doing well on medication    Hyperlipidemia    Here for follow up    Anxiety    Hyperlipidemia    Discussed the use of AI scribe software for clinical note transcription with the patient, who gave verbal consent to proceed.  History of Present Illness  Chad Ward is a 48 year old male with anxiety and hyperlipidemia who presents for a regular follow-up visit.  He takes Lexapro  for anxiety, feeling stable with no panic attacks or difficulty managing daily activities. Dizziness occurs if he misses a dose for a day or two. The medication also helps with OCD symptoms, which fluctuate but remain manageable. He occasionally feels like sleeping excessively but has no current depression issues.  He is on Lipitor for hyperlipidemia. His last cholesterol check in 2024 showed a level of 203 mg/dL, with elevated triglycerides. He is trying intermittent fasting and playing tennis to manage his condition.  He experienced a recent mild attack of diverticulitis, which resolved in a day or two, similar to previous episodes.     Health Maintenance Due  Topic Date Due   Hepatitis B Vaccines (1 of 3 - 19+ 3-dose series) Never done   COVID-19 Vaccine (4 - 2024-25 season) 10/25/2022   INFLUENZA VACCINE  09/24/2023    Past Medical History:  Diagnosis Date   Allergy    Diverticulitis    GERD (gastroesophageal reflux disease)    History of chicken pox    IBS (irritable bowel syndrome)    Meniere's disease     Past Surgical History:  Procedure Laterality Date   TONSILLECTOMY AND ADENOIDECTOMY  1988   VASECTOMY N/A 09/26/2014   Procedure: VASECTOMY;  Surgeon: Rosina Riis, MD;  Location: [facility];  Service: Urology;  Laterality: N/A;   WISDOM TOOTH EXTRACTION      Family History  Problem Relation Age of Onset   Hypertension  Mother    Diverticulitis Mother    Colitis Mother    Irritable bowel syndrome Mother    Stroke Mother    Hypertension Father    Heart attack Father    Cancer Maternal Grandfather    Diverticulitis Maternal Uncle    Irritable bowel syndrome Maternal Aunt    Prostate cancer Neg Hx    Kidney cancer Neg Hx    Bladder Cancer Neg Hx    Hearing loss Neg Hx    Colon cancer Neg Hx    Esophageal cancer Neg Hx    Rectal cancer Neg Hx    Stomach cancer Neg Hx     Social History   Socioeconomic History   Marital status: Married    Spouse name: Not on file   Number of children: 3   Years of education: Not on file   Highest education level: Master's degree (e.g., MA, MS, MEng, MEd, MSW, MBA)  Occupational History   Not on file  Tobacco Use   Smoking status: Never   Smokeless tobacco: Never  Vaping Use   Vaping status: Never Used  Substance and Sexual Activity   Alcohol use: No   Drug use: No   Sexual activity: Yes    Partners: Female  Other Topics Concern   Not on file  Social History Narrative   Works as Automotive engineer at [employer]  3 sons   Married   Completed bachelors   No pets   Enjoys movies   Social Drivers of Corporate investment banker Strain: Low Risk  (06/25/2022)   Overall Financial Resource Strain (CARDIA)    Difficulty of Paying Living Expenses: Not hard at all  Food Insecurity: No Food Insecurity (06/25/2022)   Hunger Vital Sign    Worried About Running Out of Food in the Last Year: Never true    Ran Out of Food in the Last Year: Never true  Transportation Needs: No Transportation Needs (06/25/2022)   PRAPARE - Administrator, Civil Service (Medical): No    Lack of Transportation (Non-Medical): No  Physical Activity: Insufficiently Active (06/25/2022)   Exercise Vital Sign    Days of Exercise per Week: 2 days    Minutes of Exercise per Session: 10 min  Stress: Stress Concern Present (06/25/2022)   [employer] of  Occupational Health - Occupational Stress Questionnaire    Feeling of Stress : To some extent  Social Connections: Socially Integrated (06/25/2022)   Social Connection and Isolation Panel    Frequency of Communication with Friends and Family: More than three times a week    Frequency of Social Gatherings with Friends and Family: Twice a week    Attends Religious Services: More than 4 times per year    Active Member of [employer] or Organizations: Yes    Attends Banker Meetings: 1 to 4 times per year    Marital Status: Married  Catering manager Violence: Unknown (05/30/2021)   Received from [facility]   HITS    Physically Hurt: Not on file    Insult or Talk Down To: Not on file    Threaten Physical Harm: Not on file    Scream or Curse: Not on file    Outpatient Medications Prior to Visit  Medication Sig Dispense Refill   loratadine -pseudoephedrine  (CLARITIN -D 12 HOUR) 5-120 MG tablet Take 1 tablet by mouth 2 (two) times daily.     atorvastatin  (LIPITOR) 20 MG tablet Take 1 tablet (20 mg total) by mouth daily. 30 tablet 0   escitalopram  (LEXAPRO ) 10 MG tablet Take 1 tablet (10 mg total) by mouth daily. 30 tablet 0   No facility-administered medications prior to visit.    No Known Allergies  ROS    See HPI Objective:    Physical Exam Constitutional:      General: He is not in acute distress.    Appearance: He is well-developed.  HENT:     Head: Normocephalic and atraumatic.  Cardiovascular:     Rate and Rhythm: Normal rate and regular rhythm.     Heart sounds: No murmur heard. Pulmonary:     Effort: Pulmonary effort is normal. No respiratory distress.     Breath sounds: Normal breath sounds. No wheezing or rales.  Skin:    General: Skin is warm and dry.  Neurological:     Mental Status: He is alert and oriented to person, place, and time.  Psychiatric:        Behavior: Behavior normal.        Thought Content: Thought content normal.      BP 125/80 (BP  Location: Right Arm, Patient Position: Sitting, Cuff Size: Large)   Pulse 75   Temp 98.7 F (37.1 C) (Oral)   Resp 16   Ht 5' 8 (1.727 m)   Wt 266 lb (120.7 kg)   SpO2 97%   BMI  40.45 kg/m  Wt Readings from Last 3 Encounters:  09/29/23 266 lb (120.7 kg)  09/01/22 273 lb 6.4 oz (124 kg)  06/26/22 274 lb (124.3 kg)       Assessment & Plan:   Problem List Items Addressed This Visit       Unprioritized   Obsessive-compulsive disorder   Reports that this is well controlled.       Dyslipidemia   Lab Results  Component Value Date   CHOL 203 (H) 09/01/2022   HDL 33.30 (L) 09/01/2022   LDLCALC 80 05/08/2020   LDLDIRECT 131.0 09/01/2022   TRIG 331.0 (H) 09/01/2022   CHOLHDL 6 09/01/2022   Continues lipitor. LDL good but trigs were elevated. Reinforced dietary changes: work on avoiding concentrated sweets, and limiting white carbs (rice/bread/pasta/potatoes).  Instead substitute whole grain versions with reasonable portions. Will return for FLP.        Relevant Medications   atorvastatin  (LIPITOR) 20 MG tablet   Other Relevant Orders   Comp Met (CMET)   Lipid panel   Depression   Reports stable mood on lexapro .       Relevant Medications   escitalopram  (LEXAPRO ) 10 MG tablet   Anxiety - Primary   Stable on lexapro .  Will continue.        Relevant Medications   escitalopram  (LEXAPRO ) 10 MG tablet    I am having Chad Ward maintain his Claritin -D 12 Hour, escitalopram , and atorvastatin .  Meds ordered this encounter  Medications   escitalopram  (LEXAPRO ) 10 MG tablet    Sig: Take 1 tablet (10 mg total) by mouth daily.    Dispense:  90 tablet    Refill:  1    Supervising Provider:   DOMENICA BLACKBIRD A [[id number]]   atorvastatin  (LIPITOR) 20 MG tablet    Sig: Take 1 tablet (20 mg total) by mouth daily.    Dispense:  90 tablet    Refill:  1    Supervising Provider:   DOMENICA BLACKBIRD A [[id number]]

## 2023-09-29 NOTE — Assessment & Plan Note (Signed)
Reports stable mood on lexapro.  

## 2023-09-29 NOTE — Assessment & Plan Note (Signed)
 Stable on lexapro .   Willcontinue.

## 2023-10-04 ENCOUNTER — Other Ambulatory Visit (INDEPENDENT_AMBULATORY_CARE_PROVIDER_SITE_OTHER)

## 2023-10-04 DIAGNOSIS — E785 Hyperlipidemia, unspecified: Secondary | ICD-10-CM

## 2023-10-04 LAB — LIPID PANEL
Cholesterol: 167 mg/dL (ref 0–200)
HDL: 35.9 mg/dL — ABNORMAL LOW
LDL Cholesterol: 108 mg/dL — ABNORMAL HIGH (ref 0–99)
NonHDL: 130.74
Total CHOL/HDL Ratio: 5
Triglycerides: 115 mg/dL (ref 0.0–149.0)
VLDL: 23 mg/dL (ref 0.0–40.0)

## 2023-10-04 LAB — COMPREHENSIVE METABOLIC PANEL WITH GFR
ALT: 27 U/L (ref 0–53)
AST: 22 U/L (ref 0–37)
Albumin: 4.7 g/dL (ref 3.5–5.2)
Alkaline Phosphatase: 120 U/L — ABNORMAL HIGH (ref 39–117)
BUN: 16 mg/dL (ref 6–23)
CO2: 27 meq/L (ref 19–32)
Calcium: 9 mg/dL (ref 8.4–10.5)
Chloride: 105 meq/L (ref 96–112)
Creatinine, Ser: 1.08 mg/dL (ref 0.40–1.50)
GFR: 82.05 mL/min
Glucose, Bld: 97 mg/dL (ref 70–99)
Potassium: 3.9 meq/L (ref 3.5–5.1)
Sodium: 142 meq/L (ref 135–145)
Total Bilirubin: 0.8 mg/dL (ref 0.2–1.2)
Total Protein: 7.1 g/dL (ref 6.0–8.3)

## 2023-10-05 ENCOUNTER — Ambulatory Visit: Payer: Self-pay | Admitting: Family

## 2023-12-06 DIAGNOSIS — H5203 Hypermetropia, bilateral: Secondary | ICD-10-CM | POA: Diagnosis not present

## 2023-12-06 DIAGNOSIS — H524 Presbyopia: Secondary | ICD-10-CM | POA: Diagnosis not present

## 2023-12-06 DIAGNOSIS — H52223 Regular astigmatism, bilateral: Secondary | ICD-10-CM | POA: Diagnosis not present

## 2024-02-23 ENCOUNTER — Other Ambulatory Visit (HOSPITAL_COMMUNITY): Payer: Self-pay

## 2024-02-23 ENCOUNTER — Encounter: Payer: Self-pay | Admitting: Pharmacist

## 2024-02-23 ENCOUNTER — Other Ambulatory Visit: Payer: Self-pay

## 2024-02-29 ENCOUNTER — Other Ambulatory Visit: Payer: Self-pay

## 2024-03-01 ENCOUNTER — Other Ambulatory Visit (HOSPITAL_BASED_OUTPATIENT_CLINIC_OR_DEPARTMENT_OTHER): Payer: Self-pay

## 2024-03-01 ENCOUNTER — Other Ambulatory Visit (HOSPITAL_COMMUNITY): Payer: Self-pay

## 2024-03-31 ENCOUNTER — Other Ambulatory Visit (HOSPITAL_COMMUNITY): Payer: Self-pay

## 2024-03-31 ENCOUNTER — Other Ambulatory Visit: Payer: Self-pay | Admitting: Family

## 2024-03-31 DIAGNOSIS — F419 Anxiety disorder, unspecified: Secondary | ICD-10-CM

## 2024-03-31 MED ORDER — ESCITALOPRAM OXALATE 10 MG PO TABS
10.0000 mg | ORAL_TABLET | Freq: Every day | ORAL | 0 refills | Status: AC
  Filled 2024-03-31: qty 30, 30d supply, fill #0
